# Patient Record
Sex: Female | Born: 1955 | Race: White | Hispanic: No | Marital: Married | Smoking: Never smoker
Health system: Southern US, Community
[De-identification: ages and names within clinical notes are randomized; demographics above are authoritative.]

## PROBLEM LIST (undated history)

## (undated) DIAGNOSIS — J45909 Unspecified asthma, uncomplicated: Secondary | ICD-10-CM

## (undated) DIAGNOSIS — Z8669 Personal history of other diseases of the nervous system and sense organs: Secondary | ICD-10-CM

## (undated) DIAGNOSIS — D649 Anemia, unspecified: Secondary | ICD-10-CM

## (undated) DIAGNOSIS — I10 Essential (primary) hypertension: Secondary | ICD-10-CM

## (undated) DIAGNOSIS — K5090 Crohn's disease, unspecified, without complications: Secondary | ICD-10-CM

## (undated) DIAGNOSIS — H93A9 Pulsatile tinnitus, unspecified ear: Secondary | ICD-10-CM

## (undated) HISTORY — DX: Anemia, unspecified: D64.9

## (undated) HISTORY — DX: Personal history of other diseases of the nervous system and sense organs: Z86.69

## (undated) HISTORY — DX: Essential (primary) hypertension: I10

## (undated) HISTORY — DX: Unspecified asthma, uncomplicated: J45.909

## (undated) HISTORY — PX: BREAST ENHANCEMENT SURGERY: SHX7

## (undated) HISTORY — PX: APPENDECTOMY: SHX54

## (undated) HISTORY — DX: Pulsatile tinnitus, unspecified ear: H93.A9

## (undated) HISTORY — PX: LAPAROTOMY: SHX154

## (undated) HISTORY — DX: Crohn's disease, unspecified, without complications: K50.90

---

## 2008-11-01 HISTORY — PX: ESOPHAGOGASTRODUODENOSCOPY: SHX1529

## 2008-11-05 HISTORY — PX: COLONOSCOPY: SHX174

## 2017-08-13 ENCOUNTER — Encounter: Payer: Self-pay | Admitting: Podiatry

## 2017-08-13 ENCOUNTER — Ambulatory Visit (INDEPENDENT_AMBULATORY_CARE_PROVIDER_SITE_OTHER): Payer: BC Managed Care – PPO

## 2017-08-13 ENCOUNTER — Ambulatory Visit: Payer: BC Managed Care – PPO | Admitting: Podiatry

## 2017-08-13 DIAGNOSIS — M2040 Other hammer toe(s) (acquired), unspecified foot: Secondary | ICD-10-CM

## 2017-08-13 DIAGNOSIS — M205X2 Other deformities of toe(s) (acquired), left foot: Secondary | ICD-10-CM | POA: Diagnosis not present

## 2017-08-13 DIAGNOSIS — M779 Enthesopathy, unspecified: Secondary | ICD-10-CM

## 2017-08-13 MED ORDER — TRIAMCINOLONE ACETONIDE 10 MG/ML IJ SUSP
10.0000 mg | Freq: Once | INTRAMUSCULAR | Status: AC
  Administered 2017-08-13: 10 mg

## 2017-08-13 NOTE — Progress Notes (Signed)
Subjective:   Patient ID: Tammy Carey, female   DOB: 62 y.o.   MRN: [id number]   HPI Patient presents stating her second toe has moved in the joint has become sore and it's occurred recently and seems to be more sore over the last few weeks   Review of Systems  All other systems reviewed and are negative.       Objective:  Physical Exam  Constitutional: She appears well-developed and well-nourished.  Cardiovascular: Intact distal pulses.  Pulmonary/Chest: Effort normal.  Musculoskeletal: Normal range of motion.  Neurological: She is alert.  Skin: Skin is warm.  Nursing note and vitals reviewed.   Neurovascular status found to be intact muscle strength is adequate range of motion within normal limits with patient found to have a transverse plane deformity second digit right that is laying on the hallux. It is localized to this area and there is quite a bit of inflammation of the second MPJ right with no other indications of pathology     Assessment:  Probability for capsular injury to the second MPJ right with fluid around the joint and pain with palpation     Plan:  H&P condition reviewed and recommended that we aspirated the joint injected and I did explain flexor plate dislocation and the possibility this will require surgery. I did proximal nerve block and then aspirated the second MPJ getting out of small amount of a pinkish-like fluid indicating there is been some damage to the capsule and I injected with a quarter cc deck some some Kenalog and applied thick plantar padding. Discussed shoe gear modifications and rigid bottom shoes with her  X-rays indicate that there is transverse deformity of the second digit right with no indication of fracture

## 2017-08-13 NOTE — Patient Instructions (Signed)
Hammer Toe Hammer toe is a change in the shape (a deformity) of your second, third, or fourth toe. The deformity causes the middle joint of your toe to stay bent. This causes pain, especially when you are wearing shoes. Hammer toe starts gradually. At first, the toe can be straightened. Gradually over time, the deformity becomes stiff and permanent. Early treatments to keep the toe straight may relieve pain. As the deformity becomes stiff and permanent, surgery may be needed to straighten the toe. What are the causes? Hammer toe is caused by abnormal bending of the toe joint that is closest to your foot. It happens gradually over time. This pulls on the muscles and connections (tendons) of the toe joint, making them weak and stiff. It is often related to wearing shoes that are too short or narrow and do not let your toes straighten. What increases the risk? You may be at greater risk for hammer toe if you:  Are female.  Are older.  Wear shoes that are too small.  Wear high-heeled shoes that pinch your toes.  Are a ballet dancer.  Have a second toe that is longer than your big toe (first toe).  Injure your foot or toe.  Have arthritis.  Have a family history of hammer toe.  Have a nerve or muscle disorder.  What are the signs or symptoms? The main symptoms of this condition are pain and deformity of the toe. The pain is worse when wearing shoes, walking, or running. Other symptoms may include:  Corns or calluses over the bent part of the toe or between the toes.  Redness and a burning feeling on the toe.  An open sore that forms on the top of the toe.  Not being able to straighten the toe.  How is this diagnosed? This condition is diagnosed based on your symptoms and a physical exam. During the exam, your health care provider will try to straighten your toe to see how stiff the deformity is. You may also have tests, such as:  A blood test to check for rheumatoid  arthritis.  An X-ray to show how severe the deformity is.  How is this treated? Treatment for this condition will depend on how stiff the deformity is. Surgery is often needed. However, sometimes a hammer toe can be straightened without surgery. Treatments that do not involve surgery include:  Taping the toe into a straightened position.  Using pads and cushions to protect the toe (orthotics).  Wearing shoes that provide enough room for the toes.  Doing toe-stretching exercises at home.  Taking an NSAID to reduce pain and swelling.  If these treatments do not help or the toe cannot be straightened, surgery is the next option. The most common surgeries used to straighten a hammer toe include:  Arthroplasty. In this procedure, part of the joint is removed, and that allows the toe to straighten.  Fusion. In this procedure, cartilage between the two bones of the joint is taken out and the bones are fused together into one longer bone.  Implantation. In this procedure, part of the bone is removed and replaced with an implant to let the toe move again.  Flexor tendon transfer. In this procedure, the tendons that curl the toes down (flexor tendons) are repositioned.  Follow these instructions at home:  Take over-the-counter and prescription medicines only as told by your health care provider.  Do toe straightening and stretching exercises as told by your health care provider.  Keep all   follow-up visits as told by your health care provider. This is important. How is this prevented?  Wear shoes that give your toes enough room and do not cause pain.  Do not wear high-heeled shoes. Contact a health care provider if:  Your pain gets worse.  Your toe becomes red or swollen.  You develop an open sore on your toe. This information is not intended to replace advice given to you by your health care provider. Make sure you discuss any questions you have with your health care  provider. Document Released: 07/17/2000 Document Revised: 02/07/2016 Document Reviewed: 11/13/2015 Elsevier Interactive Patient Education  2018 Elsevier Inc.  

## 2017-08-30 ENCOUNTER — Encounter: Payer: Self-pay | Admitting: Podiatry

## 2017-08-30 ENCOUNTER — Ambulatory Visit: Payer: BC Managed Care – PPO | Admitting: Podiatry

## 2017-08-30 DIAGNOSIS — M779 Enthesopathy, unspecified: Secondary | ICD-10-CM

## 2017-08-30 NOTE — Progress Notes (Signed)
Subjective:   Patient ID: Tammy Carey, female   DOB: 62 y.o.   MRN: [id number]   HPI Patient presents stating she has significant reduction of her discomfort and states the swelling seems to have reduced and the toe is still out of position but without discomfort   ROS      Objective:  Physical Exam  Neurovascular status intact with inflammation around the second MPJ quite a bit improved with very mild discomfort upon palpation and no indication currently of digital instability except for the medial rotation     Assessment:  Patient is doing well with good alignment noted second digit in good position and minimal swelling noted     Plan:  H&P condition reviewed and discussed rigid bottom shoes anti-inflammatories and patient will be seen back if symptoms were to persist or get worse.  Ultimately may require surgery or orthotics depending on response

## 2018-03-21 ENCOUNTER — Encounter: Payer: Self-pay | Admitting: Gastroenterology

## 2018-04-18 ENCOUNTER — Encounter: Payer: Self-pay | Admitting: Gastroenterology

## 2018-04-19 ENCOUNTER — Encounter: Payer: Self-pay | Admitting: Gastroenterology

## 2018-04-19 ENCOUNTER — Encounter

## 2018-04-19 ENCOUNTER — Encounter (INDEPENDENT_AMBULATORY_CARE_PROVIDER_SITE_OTHER): Payer: Self-pay

## 2018-04-19 ENCOUNTER — Ambulatory Visit (INDEPENDENT_AMBULATORY_CARE_PROVIDER_SITE_OTHER): Payer: BC Managed Care – PPO | Admitting: Gastroenterology

## 2018-04-19 VITALS — BP 120/82 | HR 74 | Ht 66.5 in | Wt 184.0 lb

## 2018-04-19 DIAGNOSIS — K50919 Crohn's disease, unspecified, with unspecified complications: Secondary | ICD-10-CM

## 2018-04-19 DIAGNOSIS — Z1211 Encounter for screening for malignant neoplasm of colon: Secondary | ICD-10-CM

## 2018-04-19 DIAGNOSIS — Z8719 Personal history of other diseases of the digestive system: Secondary | ICD-10-CM | POA: Diagnosis not present

## 2018-04-19 NOTE — Patient Instructions (Signed)
If you are age 61 or older, your body mass index should be between 23-30. Your Body mass index is 29.25 kg/m. If this is out of the aforementioned range listed, please consider follow up with your Primary Care Provider.  If you are age 41 or younger, your body mass index should be between 19-25. Your Body mass index is 29.25 kg/m. If this is out of the aformentioned range listed, please consider follow up with your Primary Care Provider.   You have been scheduled for a colonoscopy. Please follow written instructions given to you at your visit today.  Please pick up your prep supplies at the pharmacy within the next 1-3 days. If you use inhalers (even only as needed), please bring them with you on the day of your procedure. Your physician has requested that you go to www.startemmi.com and enter the access code given to you at your visit today. This web site gives a general overview about your procedure. However, you should still follow specific instructions given to you by our office regarding your preparation for the procedure.  Thank you,  Dr. Jackquline Denmark

## 2018-04-19 NOTE — Progress Notes (Signed)
Chief Complaint: Recent diverticulitis  Referring Provider: Dr. Marco Collie      ASSESSMENT AND PLAN;   #1. Colorectal cancer screening. #2. H/O diverticulitis (CT June 2019, cipro and flagyl)). #3. Crohn's disease: Dx 2006, involving terminal ileum. Elevated CRP.  Subsequent colonoscopies were negative.  Last colonoscopy 11/2008- with negative TI and random colonic biopsies.  Did show mild sigmoid diverticulosis #4. 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 Uterine fibroids (on CT 8.2 cm 09/2008). Plan: - Recent CT report from [facility]. - Proceed with colonoscopy with MiraLAX preparation.  I discussed risks and benefits.  This will be scheduled in coming days. - Continue gluten-free diet.  Watch fiber intake.  Excessive fiber may cause diarrhea and more abdominal bloating. - Consider IBD serology if needed.   HPI:    Tammy Carey is a 62 y.o. female  Seen at request of Dr. Nyra Capes H/O diverticulitis on CT June 2019, treated with Cipro and Flagyl for 10 days. (she had right sided abdominal pain, CT showed left sigmoid diverticulitis without any abscesses.  C-reactive protein was significantly elevated 23 -recent C-reactive protein was 3) No nausea, vomiting, heartburn, regurgitation, odynophagia or dysphagia.  No significant diarrhea or constipation.  There is no melena or hematochezia. No unintentional weight loss. Has been on gluten-free diet-feels better.  Past GI procedures: -Colonoscopy ([facility]) 11/05/2008: Mild sigmoid diverticulosis, otherwise normal colon to TI, neg TI and random colonic biopsies. Past Medical History:  Diagnosis Date  . Anemia   . Asthma   . Crohn's disease ([place])   . Hx of migraine headaches     Past Surgical History:  Procedure Laterality Date  . APPENDECTOMY    . COLONOSCOPY  11/05/2008   Mild colonic diverticulosis. Otherwise normal colonoscopy to TI. Minimal  activity by endoscopic criteria.   [facility] Kitchen ESOPHAGOGASTRODUODENOSCOPY  11/01/2008   Normal EGD.   Marland Kitchen LAPAROTOMY     for endometriosis    Family History  Problem Relation Age of Onset  . Hypertension Mother     Social History  Assoc Agricultural consultant Tobacco Use  . Smoking status: Never Smoker  . Smokeless tobacco: Never Used  Substance Use Topics  . Alcohol use: Yes    Alcohol/week: 3.0 standard drinks    Types: 3 Standard drinks or equivalent per week  . Drug use: Never    Current Outpatient Medications  Medication Sig Dispense Refill  . ESTROGENS CONJ SYNTHETIC A PO Take 1 tablet by mouth daily.    . progesterone (PROMETRIUM) 100 MG capsule      No current facility-administered medications for this visit.     Allergies  Allergen Reactions  . Penicillins Other (See Comments)    Pt. Stated," I just know I am."    Review of Systems:  Constitutional: Denies fever, chills, diaphoresis, appetite change and fatigue.  HEENT: Denies photophobia, eye pain, redness, hearing loss, ear pain, congestion, sore throat, rhinorrhea, sneezing, mouth sores, neck pain, neck stiffness and tinnitus.   Respiratory: Denies SOB, DOE, cough, chest tightness,  and wheezing.   Cardiovascular: Denies chest pain, palpitations and leg swelling.  Genitourinary: Denies dysuria, urgency, frequency, hematuria, flank pain and difficulty urinating.  Musculoskeletal: Denies myalgias, back pain, joint swelling, arthralgias and gait problem.  Skin: Has a small skin lesion in the right upper chest Neurological: Denies dizziness, seizures, syncope, weakness, light-headedness, numbness and has migraine headaches.  Hematological: Denies adenopathy. Easy bruising, personal or  family bleeding history  Psychiatric/Behavioral: Has anxiety, no depression     Physical Exam:    BP 120/82   Pulse 74   Ht 5' 6.5" (1.689 m)   Wt 184 lb (83.5 kg)   SpO2 95%   BMI 29.25 kg/m  Filed Weights   04/19/18 0947  Weight:  184 lb (83.5 kg)   Constitutional:  Well-developed, in no acute distress. Psychiatric: Normal mood and affect. Behavior is normal. HEENT: Pupils normal.  Conjunctivae are normal. No scleral icterus. Cardiovascular: Normal rate, regular rhythm. No edema Pulmonary/chest: Effort normal and breath sounds normal. No wheezing, rales or rhonchi. Abdominal: Soft, nondistended. Nontender. Bowel sounds active throughout. There are no masses palpable. No hepatomegaly. Rectal:  defered Neurological: Alert and oriented to person place and time. Skin: Skin is warm and dry. No rashes noted.     [name], MD 04/19/2018, 9:56 AM  Cc: Dr. Marco Collie

## 2018-05-04 ENCOUNTER — Encounter: Payer: Self-pay | Admitting: Gastroenterology

## 2018-05-04 ENCOUNTER — Ambulatory Visit (AMBULATORY_SURGERY_CENTER): Payer: BC Managed Care – PPO | Admitting: Gastroenterology

## 2018-05-04 VITALS — BP 122/68 | HR 60 | Temp 97.8°F | Resp 11 | Ht 66.5 in | Wt 184.0 lb

## 2018-05-04 DIAGNOSIS — K50919 Crohn's disease, unspecified, with unspecified complications: Secondary | ICD-10-CM

## 2018-05-04 MED ORDER — SODIUM CHLORIDE 0.9 % IV SOLN: 500.0000 mL | Freq: Once | INTRAVENOUS | Status: DC

## 2018-05-04 NOTE — Progress Notes (Signed)
Called to room to assist during endoscopic procedure.  Patient ID and intended procedure confirmed with present staff. Received instructions for my participation in the procedure from the performing physician.  

## 2018-05-04 NOTE — Op Note (Signed)
[facility] Patient Name: Tammy Carey Procedure Date: 05/04/2018 9:21 AM MRN: [id number] Endoscopist: Jackquline Denmark , MD Age: 62 Referring MD:  Date of Birth: March 25, 1956 Gender: Female Account #: [id number] Procedure:                Colonoscopy Indications:              #1. Colorectal cancer screening.                           #2. H/O diverticulitis (CT June 2019, cipro and                            flagyl)).                           #3. Crohn's disease: Dx 2006, involving terminal                            ileum.                           #4. IBS with diarrhea (may have non-celiac gluten                            sensitivity, negative small bowel biopsies for                            celiac on EGD 11/2008) Medicines:                Monitored Anesthesia Care Procedure:                Pre-Anesthesia Assessment:                           - Prior to the procedure, a History and Physical                            was performed, and patient medications and                            allergies were reviewed. The patient's tolerance of                            previous anesthesia was also reviewed. The risks                            and benefits of the procedure and the sedation                            options and risks were discussed with the patient.                            All questions were answered, and informed consent                            was obtained. Prior Anticoagulants: The patient has  taken no previous anticoagulant or antiplatelet                            agents. ASA Grade Assessment: II - A patient with                            mild systemic disease. After reviewing the risks                            and benefits, the patient was deemed in                            satisfactory condition to undergo the procedure.                           After obtaining informed consent, the colonoscope         was passed under direct vision. Throughout the                            procedure, the patient's blood pressure, pulse, and                            oxygen saturations were monitored continuously. The                            Model PCF-H190DL [id number]) scope was introduced                            through the anus and advanced to the 4 cm into the                            ileum. The colonoscopy was performed without                            difficulty. The patient tolerated the procedure                            well. The quality of the bowel preparation was good. Scope In: 9:37:40 AM Scope Out: 9:55:16 AM Scope Withdrawal Time: 0 hours 13 minutes 14 seconds  Total Procedure Duration: 0 hours 17 minutes 36 seconds  Findings:                 A few scattered non-bleeding erosions were found in                            the ascending colon and in the cecum. 1 cm single                            superficial ulcer was also noted in the distal                            ascending colon. No stigmata of recent bleeding  were seen. Biopsies were taken with a cold forceps                            for histology. Estimated blood loss: none.                           The terminal ileum appeared normal. Biopsies were                            taken with a cold forceps for histology. Estimated                            blood loss: none. The remaining colonic mucosa was                            normal. Multiple biopsies were also obtained from                            the left colon including descending colon, sigmoid                            colon and from the rectum and for histology                           Multiple small-mouthed diverticula were found in                            the sigmoid colon and descending colon.                           Non-bleeding internal hemorrhoids were found during                            retroflexion. The  hemorrhoids were small.                           The exam was otherwise without abnormality. Complications:            No immediate complications. Estimated Blood Loss:     Estimated blood loss: none. Impression:               - A few erosions in the ascending colon and in the                            cecum. Biopsied.                           - Moderate left colonic diverticulosis.                           - Non-bleeding internal hemorrhoids. Recommendation:           - Patient has a contact number available for                            emergencies. The  signs and symptoms of potential                            delayed complications were discussed with the                            patient. Return to normal activities tomorrow.                            Written discharge instructions were provided to the                            patient.                           - Resume previous diet.                           - No aspirin, ibuprofen, naproxen, or other                            non-steroidal anti-inflammatory drugs.                           - Continue present medications.                           - Await pathology results.                           - Repeat colonoscopy for surveillance based on                            pathology results.                           - Return to GI clinic in 12 weeks. Jackquline Denmark, MD 05/04/2018 10:04:36 AM This report has been signed electronically.

## 2018-05-04 NOTE — Progress Notes (Signed)
To PACU, VSS. Report to [email or web address] 

## 2018-05-04 NOTE — Patient Instructions (Signed)
Impression/Recommendations:  Diverticulosis handout given to patient. Hemorrhoid handout given to patient.  Resume previous diet. Continue present medications.  No aspirin, ibuprofen, naproxen, or other NSAID drugs.  Tylenol only.  Repeat colonoscopy for surveillance.  Return to GI clinic in 12 weeks.  YOU HAD AN ENDOSCOPIC PROCEDURE TODAY AT [facility]:   Refer to the procedure report that was given to you for any specific questions about what was found during the examination.  If the procedure report does not answer your questions, please call your gastroenterologist to clarify.  If you requested that your care partner not be given the details of your procedure findings, then the procedure report has been included in a sealed envelope for you to review at your convenience later.  YOU SHOULD EXPECT: Some feelings of bloating in the abdomen. Passage of more gas than usual.  Walking can help get rid of the air that was put into your GI tract during the procedure and reduce the bloating. If you had a lower endoscopy (such as a colonoscopy or flexible sigmoidoscopy) you may notice spotting of blood in your stool or on the toilet paper. If you underwent a bowel prep for your procedure, you may not have a normal bowel movement for a few days.  Please Note:  You might notice some irritation and congestion in your nose or some drainage.  This is from the oxygen used during your procedure.  There is no need for concern and it should clear up in a day or so.  SYMPTOMS TO REPORT IMMEDIATELY:   Following lower endoscopy (colonoscopy or flexible sigmoidoscopy):  Excessive amounts of blood in the stool  Significant tenderness or worsening of abdominal pains  Swelling of the abdomen that is new, acute  Fever of 100F or higher  For urgent or emergent issues, a gastroenterologist can be reached at any hour by calling [phone].   DIET:  We do recommend a small meal at first,  but then you may proceed to your regular diet.  Drink plenty of fluids but you should avoid alcoholic beverages for 24 hours.  ACTIVITY:  You should plan to take it easy for the rest of today and you should NOT DRIVE or use heavy machinery until tomorrow (because of the sedation medicines used during the test).    FOLLOW UP: Our staff will call the number listed on your records the next business day following your procedure to check on you and address any questions or concerns that you may have regarding the information given to you following your procedure. If we do not reach you, we will leave a message.  However, if you are feeling well and you are not experiencing any problems, there is no need to return our call.  We will assume that you have returned to your regular daily activities without incident.  If any biopsies were taken you will be contacted by phone or by letter within the next 1-3 weeks.  Please call us at [phone] if you have not heard about the biopsies in 3 weeks.    SIGNATURES/CONFIDENTIALITY: You and/or your care partner have signed paperwork which will be entered into your electronic medical record.  These signatures attest to the fact that that the information above on your After Visit Summary has been reviewed and is understood.  Full responsibility of the confidentiality of this discharge information lies with you and/or your care-partner.

## 2018-05-05 ENCOUNTER — Telehealth: Payer: Self-pay

## 2018-05-05 NOTE — Telephone Encounter (Signed)
  Follow up Call-  Call Tammy Carey number 05/04/2018  Post procedure Call Tammy Carey phone  # [phone]  Permission to leave phone message Yes     Patient questions:  Do you have a fever, pain , or abdominal swelling? No. Pain Score  0 *  Have you tolerated food without any problems? Yes.    Have you been able to return to your normal activities? Yes.    Do you have any questions about your discharge instructions: Diet   No. Medications  No. Follow up visit  No.  Do you have questions or concerns about your Care? No.  Actions: * If pain score is 4 or above: No action needed, pain <4.

## 2018-05-05 NOTE — Telephone Encounter (Signed)
Left message for first follow up call.

## 2018-05-16 ENCOUNTER — Telehealth: Payer: Self-pay | Admitting: Gastroenterology

## 2018-05-16 ENCOUNTER — Other Ambulatory Visit: Payer: Self-pay

## 2018-05-16 MED ORDER — BUDESONIDE 3 MG PO CPEP: ORAL_CAPSULE | ORAL | 2 refills | Status: DC

## 2018-05-16 MED ORDER — MESALAMINE 1.2 G PO TBEC: 2.4000 g | DELAYED_RELEASE_TABLET | Freq: Every day | ORAL | 3 refills | Status: DC

## 2018-05-16 NOTE — Telephone Encounter (Signed)
Pt called stating that she was returning your call. She would like a call back before 3:30pm if possible as she would be attending a meeting.

## 2018-05-16 NOTE — Telephone Encounter (Signed)
Pt requested prescriptions be sent to [facility] on [address] in [place], scripts sent to [facility] per request.

## 2018-06-02 ENCOUNTER — Other Ambulatory Visit: Payer: Self-pay

## 2018-06-02 ENCOUNTER — Telehealth: Payer: Self-pay | Admitting: Gastroenterology

## 2018-06-02 DIAGNOSIS — K50919 Crohn's disease, unspecified, with unspecified complications: Secondary | ICD-10-CM

## 2018-06-02 MED ORDER — METRONIDAZOLE IN NACL 5-0.79 MG/ML-% IV SOLN
500.00 | INTRAVENOUS | Status: DC
Start: 2018-06-02 — End: 2018-06-02

## 2018-06-02 MED ORDER — ALBUTEROL SULFATE HFA 108 (90 BASE) MCG/ACT IN AERS
2.00 | INHALATION_SPRAY | RESPIRATORY_TRACT | Status: DC
Start: ? — End: 2018-06-02

## 2018-06-02 MED ORDER — ONDANSETRON 4 MG PO TBDP
4.00 | ORAL_TABLET | ORAL | Status: DC
Start: ? — End: 2018-06-02

## 2018-06-02 MED ORDER — PREDNISONE 10 MG PO TABS: ORAL_TABLET | ORAL | 0 refills | Status: DC

## 2018-06-02 MED ORDER — IOHEXOL 350 MG/ML SOLN
100.00 | INTRAVENOUS | Status: DC
Start: ? — End: 2018-06-02

## 2018-06-02 MED ORDER — OXYCODONE HCL 5 MG PO TABS
5.00 | ORAL_TABLET | ORAL | Status: DC
Start: ? — End: 2018-06-02

## 2018-06-02 MED ORDER — CIPROFLOXACIN IN D5W 400 MG/200ML IV SOLN
400.00 | INTRAVENOUS | Status: DC
Start: 2018-06-02 — End: 2018-06-02

## 2018-06-02 MED ORDER — ENOXAPARIN SODIUM 40 MG/0.4ML ~~LOC~~ SOLN
40.00 | SUBCUTANEOUS | Status: DC
Start: 2018-06-02 — End: 2018-06-02

## 2018-06-02 NOTE — Telephone Encounter (Signed)
Pt is hospitalized due to a crohns flare up, she wants to know Dr. Steve Rattler advise to prevent it from happening again.

## 2018-06-02 NOTE — Telephone Encounter (Signed)
Pt called again because she is at the hospital and she don't want to live until she speak with the nurse.

## 2018-06-02 NOTE — Telephone Encounter (Signed)
Reviewed in detail with the patient. Left the phone number on voicemail per her request. Rx to [facility]. She repeats back the plan of care. Thanks me for my call.

## 2018-06-02 NOTE — Telephone Encounter (Signed)
I tried calling the physician at [facility] last night-there was no answer Have reviewed the records I think there is an exacerbation of Crohn's disease as well. She can stop taking budesonide Continue mesalamine Must take and finish antibiotics  Also needs a short course of steroids.  Tammy Carey, Lets start prednisone 20 mg p.o. once a day x 2 weeks, then 10 mg p.o. once a day for 2 more weeks.  She is to call us and let us know how she is doing next week as well. Pl give her my cell No  [phone].  She can text me or call me if she has any problems over the weekend

## 2018-06-02 NOTE — Telephone Encounter (Signed)
Patient was discharged from the hospital in her area today. You can view the records from within Care Everywhere. She was in [facility]. She has had a CT. Diagnosed with diverticulitis. She does not have confidence in this diagnosis. She is on Cipro and Flagyl. She states she received Solu-medrol injection.  She is very stressed about her situation. Concerned this is a flare because she has been in remission for so very long. The pain she experienced was "terrible." She would like you to review her records and advise her please.  Thank you

## 2018-06-21 ENCOUNTER — Telehealth: Payer: Self-pay | Admitting: Gastroenterology

## 2018-06-21 NOTE — Telephone Encounter (Signed)
Pt called to inform that she is doing well, she is about to finish prednisone and is still taking lialda, she wants to know if she needs to go back to entocort or what Dr. Lyndel Safe suggests so she does not have another flare up.

## 2018-06-21 NOTE — Telephone Encounter (Signed)
Please refer to patient message

## 2018-06-21 NOTE — Telephone Encounter (Signed)
She just finished prednisone Entocort -would be less effective than prednisone. Lets continue Lialda If we could get blood tests repeated-CBC, CMP and C-reactive protein. Get TB gold, HbsAg and TPMT genotype.  We may have to start her on Humira if still with problems. She having any diarrhea?

## 2018-06-22 ENCOUNTER — Other Ambulatory Visit: Payer: Self-pay

## 2018-06-22 DIAGNOSIS — K50919 Crohn's disease, unspecified, with unspecified complications: Secondary | ICD-10-CM

## 2018-06-22 NOTE — Telephone Encounter (Signed)
Called and spoke with patient- patient states "things are getting better with the diarrhea"; patient reports she still has 7 days left to take the Prednisone; patient informed of MD recommendations and is agreeable with the plan of care; patient reports she would like to have the blood work done at the [facility] lab; orders will be placed in [place]; patient verbalized understanding to finish the Prednisone dosage, continue Lialda, and to call back if symptoms return or worsen; patient also advised to call back if questions/concerns arise;

## 2018-06-27 ENCOUNTER — Other Ambulatory Visit (INDEPENDENT_AMBULATORY_CARE_PROVIDER_SITE_OTHER): Payer: BC Managed Care – PPO

## 2018-06-27 DIAGNOSIS — K50919 Crohn's disease, unspecified, with unspecified complications: Secondary | ICD-10-CM

## 2018-06-27 LAB — COMPREHENSIVE METABOLIC PANEL
ALT: 14 U/L (ref 0–35)
AST: 14 U/L (ref 0–37)
Albumin: 4.1 g/dL (ref 3.5–5.2)
Alkaline Phosphatase: 37 U/L — ABNORMAL LOW (ref 39–117)
BILIRUBIN TOTAL: 0.4 mg/dL (ref 0.2–1.2)
BUN: 13 mg/dL (ref 6–23)
CALCIUM: 9.1 mg/dL (ref 8.4–10.5)
CHLORIDE: 102 meq/L (ref 96–112)
CO2: 29 meq/L (ref 19–32)
Creatinine, Ser: 0.7 mg/dL (ref 0.40–1.20)
GFR: 90.28 mL/min (ref >60.00)
GLUCOSE: 125 mg/dL — AB (ref 70–99)
Potassium: 4.1 mEq/L (ref 3.5–5.1)
SODIUM: 139 meq/L (ref 135–145)
Total Protein: 7 g/dL (ref 6.0–8.3)

## 2018-06-27 LAB — CBC WITH DIFFERENTIAL/PLATELET
BASOS ABS: 0.1 10*3/uL (ref 0.0–0.1)
Basophils Relative: 1.4 % (ref 0.0–3.0)
EOS PCT: 0.1 % (ref 0.0–5.0)
Eosinophils Absolute: 0 10*3/uL (ref 0.0–0.7)
HEMATOCRIT: 38.5 % (ref 36.0–46.0)
Hemoglobin: 12.9 g/dL (ref 12.0–15.0)
LYMPHS PCT: 10.5 % — AB (ref 12.0–46.0)
Lymphs Abs: 1 10*3/uL (ref 0.7–4.0)
MCHC: 33.5 g/dL (ref 30.0–36.0)
MCV: 89.9 fl (ref 78.0–100.0)
MONOS PCT: 2.3 % — AB (ref 3.0–12.0)
Monocytes Absolute: 0.2 10*3/uL (ref 0.1–1.0)
NEUTROS ABS: 7.9 10*3/uL — AB (ref 1.4–7.7)
Neutrophils Relative %: 85.7 % — ABNORMAL HIGH (ref 43.0–77.0)
PLATELETS: 411 10*3/uL — AB (ref 150.0–400.0)
RBC: 4.28 Mil/uL (ref 3.87–5.11)
RDW: 15.4 % (ref 11.5–15.5)
WBC: 9.2 10*3/uL (ref 4.0–10.5)

## 2018-06-27 LAB — C-REACTIVE PROTEIN: CRP: 0.5 mg/dL (ref 0.5–20.0)

## 2018-06-28 ENCOUNTER — Other Ambulatory Visit: Payer: Self-pay

## 2018-06-28 NOTE — Progress Notes (Signed)
Humira ordered through [facility] for starter kit and maintenance dosing schedule;

## 2018-07-02 LAB — QUANTIFERON-TB GOLD PLUS
MITOGEN-NIL: 9.33 [IU]/mL
NIL: 0.02 IU/mL
QuantiFERON-TB Gold Plus: NEGATIVE
TB1-NIL: 0 [IU]/mL
TB2-NIL: 0 [IU]/mL

## 2018-07-02 LAB — THIOPURINE METHYLTRANSFERASE (TPMT), RBC: THIOPURINE METHYLTRANSFERASE, RBC: 16 nmol/h/mL

## 2018-07-02 LAB — HEPATITIS B SURFACE ANTIGEN: Hepatitis B Surface Ag: NONREACTIVE

## 2018-07-06 ENCOUNTER — Telehealth: Payer: Self-pay

## 2018-07-06 NOTE — Telephone Encounter (Signed)
Called and spoke with patient-patient informed of Humira being approved by [facility] and is being handled by [facility]; patient verbalized understanding of information/instructions; patient requesting to know if she should continue the Lialda while taking Humira? Is it okay to wait to start the Humira until after Christmas as she is not wanting side effects all while celebrating wedding anniversary and [place] resort she is unable to get a refund for? Please advise;

## 2018-07-07 NOTE — Telephone Encounter (Signed)
Lets continue Lialda Okay to start Humira after Christmas When you talk to her, tell her I said Tammy Carey Christmas and happy new year to her and her husband Lysle Rubens

## 2018-07-07 NOTE — Telephone Encounter (Signed)
Called and spoke to patient-patient informed of MD recommendations (special message) and patient is agreeable to plan of care; patient advised to call back if questions/concerns arise;

## 2018-07-08 ENCOUNTER — Other Ambulatory Visit: Payer: Self-pay

## 2018-07-08 DIAGNOSIS — K50919 Crohn's disease, unspecified, with unspecified complications: Secondary | ICD-10-CM

## 2018-07-08 MED ORDER — ADALIMUMAB 40 MG/0.4ML ~~LOC~~ AJKT: 1.0000 "pen " | AUTO-INJECTOR | SUBCUTANEOUS | 12 refills | Status: DC

## 2018-07-08 NOTE — Telephone Encounter (Signed)
Patient calling regarding humira, she needs contact info for the pharmacy that we use for her humira prescriptions. Pls call her.

## 2018-07-08 NOTE — Telephone Encounter (Addendum)
Humira RX submitted to [facility]; patient aware of RX being sent;

## 2018-07-11 ENCOUNTER — Telehealth: Payer: Self-pay | Admitting: Gastroenterology

## 2018-07-12 NOTE — Telephone Encounter (Signed)
Called and spoke with patient- patient report symptoms started on Saturday -fatigue and low low back pain-patient denies nausea, vomiting, diarrhea, abdominal pain; patient states "I am just feeling really fatigued and I am having low, low back pain that I just ended up taking a Tylenol for but I did not know if this could be a viral thing or not"; patient advised since there were not any gi symptoms patient should call her PCP and to call this office back if questions/concerns arise pertaining to gi symptoms; patient also reports she has not started the Humira as of yet therefore there are no reactions to contribute her fatigue or low back pain; patient is agreeable with "riding out the symptoms and seeing if I get any better and I will call Dr. Lyndel Safe back if I feel I need to be seen or need answers to questions;

## 2018-08-05 ENCOUNTER — Telehealth: Payer: Self-pay | Admitting: Gastroenterology

## 2018-08-05 NOTE — Telephone Encounter (Signed)
We can Rx dicyclomine 20 mg q 6 hrs prn # 60 no refill  It can relieve cramps and pain  She should know someone always on call if things worsen but I would not start steroids at this time

## 2018-08-05 NOTE — Telephone Encounter (Signed)
Pt thinks that she is having a flare, she states to be on day 5th of humira. Would like a call.

## 2018-08-05 NOTE — Telephone Encounter (Signed)
Called and spoke with patient-patient reports lower back discomfort, "blah" feeling, generalized aches and pains, low grade temp (99.6 at the highest-currently 97.2), abdominal tenderness (0-10=pain with palpation (pain=4-5); random nausea no vomiting; normal bowel movements for patient until yesterday when the "ribbon poops" 6 times/day; patient also reports she took her Humira on 07/31/18 160 mg dose= not due for the next injection until 08/15/2018;  Patient is requesting to know if there is anything else she can do for her "flare up" she feels she is having right now besides the Mesalamine; she does not know if this is truly the beginning of a flare however, wanted to know should the symptoms worsen over the weekend; these are "not my normal symptoms and I just need to know if there is anything else I need to be doing"

## 2018-08-05 NOTE — Telephone Encounter (Signed)
Patient is advised. She says she has Dicyclomine. She will try this. Aware of the after hours contact system.

## 2018-09-19 ENCOUNTER — Telehealth: Payer: Self-pay | Admitting: Gastroenterology

## 2018-09-19 DIAGNOSIS — K50919 Crohn's disease, unspecified, with unspecified complications: Secondary | ICD-10-CM

## 2018-09-19 NOTE — Telephone Encounter (Signed)
Called and spoke with patient-patient reports she started the Humira on Jan 7th; stress level is up per patient report-husband had a heart on Sunday 08/28/2018; patient reports she has also been interviewing for an alternative job as well-patient reports she has not been eating like she is supposed to -no fever, sensitive on RLQ and below umbilicus-PCP reports she may be having "a flare" and was prescribed Prednisone 40mg  on Friday pm-took 60mg  on Saturday and Sunday-has enough on RX for 3 weeks- was advised by PCP to notify Dr. Helene Shoe 09/10/2018 patient took her 2nd 40 mg dose of Humira- Patient reports she is not in any distress or pain at this time-just need to know the next step;  Please advise if there are any additional steps she needs to be taking at this time-patient reports she is trying to control her stress level-however, does not want to stay on the Prednisone any longer than needed;

## 2018-09-19 NOTE — Telephone Encounter (Signed)
Pt stated she just had a flare up.  Pt's PCP started her on prednisone.  Pt is on 4th dose of Humira.  Please CB.

## 2018-09-20 NOTE — Telephone Encounter (Signed)
Pt called back wanting to speak with the nurse regarding call from yesterday

## 2018-09-21 MED ORDER — PREDNISONE 10 MG PO TABS: ORAL_TABLET | ORAL | 0 refills | Status: DC

## 2018-09-21 NOTE — Telephone Encounter (Signed)
Called and spoke with patient-patient informed of MD recommendations; patient is agreeable with plan of care RX sent to pharmacy; lab orders placed in Epic; appt for lab work scheduled for patient; Patient verbalized understanding of information/instructions;  Patient was advised to call the office at [phone] if questions/concerns arise;

## 2018-09-21 NOTE — Telephone Encounter (Signed)
PT calling back

## 2018-09-21 NOTE — Telephone Encounter (Signed)
-  Prednisone 40 mg p.o. once a day for 1 week, 30 mg p.o. once a day for 1 week, followed by 20 mg p.o. once a day for 1 week, then 10 mg p.o. once a day for 2 weeks and then stop. -Continue with humira (dose as prescribed) for now -Check CBC, CMP, sed rate and CRP in 4 weeks -How is Tammy Carey doing? (her husband). -Also give Tammy Carey my cell phone.  She can also text me.

## 2018-09-21 NOTE — Telephone Encounter (Signed)
Please see the message from 09/19/2018 at 10:30 am and please advise

## 2018-11-04 ENCOUNTER — Ambulatory Visit: Payer: BC Managed Care – PPO | Admitting: Gastroenterology

## 2018-11-11 ENCOUNTER — Other Ambulatory Visit: Payer: BC Managed Care – PPO

## 2019-01-04 ENCOUNTER — Other Ambulatory Visit: Payer: Self-pay | Admitting: Gastroenterology

## 2019-04-02 ENCOUNTER — Other Ambulatory Visit: Payer: Self-pay | Admitting: Gastroenterology

## 2019-04-11 ENCOUNTER — Telehealth: Payer: Self-pay | Admitting: Gastroenterology

## 2019-04-13 NOTE — Telephone Encounter (Signed)
Called and spoke with patient-patient reports she had her blood drawn on 04/12/2019-just so Dr. Lyndel Safe is aware;  Patient also reports she had used some steroid eye drops- 3 on Saturday, 2 on Sunday, and 1 time on Monday; called PCP and was informed it may be related to Crohn's-took Humira on Sunday-  Fatigue, low back pain, RLQ/LLQ abd tenderness,  UA was negative, waiting on CBC and CRP;   "Feeling fine today"- just wanted to report she is still taking the Mesalamine as well; patient reports she just wanted Dr.Gupta to be aware of potential "flare" after using a steroidal eye drop;  Please advise if the patient needs to do anything further for her care as she reports she is being followed closely by PCP at this time;

## 2019-04-17 NOTE — Telephone Encounter (Signed)
Bre Can you get the lab results from Dr Marco Collie for me (drawn 9/9) Then, I will call her  Thx  RG

## 2019-04-20 NOTE — Telephone Encounter (Signed)
Paperwork requested from Dr. Marco Collie office; will place on MD desk when becomes available;

## 2019-04-24 NOTE — Telephone Encounter (Signed)
Paper work has been placed on MD desk for review

## 2019-05-09 NOTE — Telephone Encounter (Signed)
Trish, Can you pl place it on my desk (separate stack please) thx RG

## 2019-06-26 ENCOUNTER — Other Ambulatory Visit: Payer: Self-pay | Admitting: Gastroenterology

## 2019-06-26 DIAGNOSIS — K50919 Crohn's disease, unspecified, with unspecified complications: Secondary | ICD-10-CM

## 2019-07-22 ENCOUNTER — Other Ambulatory Visit: Payer: Self-pay | Admitting: Gastroenterology

## 2019-09-19 ENCOUNTER — Other Ambulatory Visit: Payer: Self-pay | Admitting: Gastroenterology

## 2019-09-19 DIAGNOSIS — K50919 Crohn's disease, unspecified, with unspecified complications: Secondary | ICD-10-CM

## 2019-10-20 ENCOUNTER — Other Ambulatory Visit: Payer: Self-pay | Admitting: Gastroenterology

## 2019-11-09 ENCOUNTER — Other Ambulatory Visit: Payer: Self-pay | Admitting: Gastroenterology

## 2019-11-09 DIAGNOSIS — K50919 Crohn's disease, unspecified, with unspecified complications: Secondary | ICD-10-CM

## 2019-12-19 ENCOUNTER — Other Ambulatory Visit: Payer: Self-pay

## 2019-12-19 ENCOUNTER — Ambulatory Visit: Payer: BC Managed Care – PPO | Admitting: Gastroenterology

## 2019-12-19 ENCOUNTER — Encounter: Payer: Self-pay | Admitting: Gastroenterology

## 2019-12-19 VITALS — BP 126/80 | HR 73 | Temp 96.9°F | Ht 66.0 in | Wt 186.1 lb

## 2019-12-19 DIAGNOSIS — K50919 Crohn's disease, unspecified, with unspecified complications: Secondary | ICD-10-CM | POA: Diagnosis not present

## 2019-12-19 NOTE — Patient Instructions (Signed)
If you are age 63 or older, your body mass index should be between 23-30. Your Body mass index is 30.04 kg/m. If this is out of the aforementioned range listed, please consider follow up with your Primary Care Provider.  If you are age 81 or younger, your body mass index should be between 19-25. Your Body mass index is 30.04 kg/m. If this is out of the aformentioned range listed, please consider follow up with your Primary Care Provider.   Take mesalamine once daily x 2 weeks and then stop.   Follow up in 6 months.   Thank you,  Dr. Jackquline Denmark

## 2019-12-19 NOTE — Progress Notes (Signed)
Chief Complaint: Recent diverticulitis  Referring Provider: Dr. Marco Collie      ASSESSMENT AND PLAN;   #1. Crohn's disease: Dx 2006, involving R colon. TI Nl on colon 05/2018. Elevated CRP.  #2. H/O diverticulitis (CT June 2019, cipro and flagyl)). 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 Uterine fibroids (on CT 8.2 cm 09/2008).  Plan: - Continue Humira 40mg  Q2 weekly - Wean off mesalamine 1/2 weeks - Check CBC, CMP, CRP and sed rate. - Obtain records from Dr. Nyra Capes office. - FU in 6 months  Addendum: We obtained a blood work from Dr. Nyra Capes office on 11/25/2018 when her WBC count was 6.6, hemoglobin 13.2, MCV 91, platelets 433.  Normal CMP with BUN 12/creatinine 0.78.  Normal LFTs with AST 13, ALT 11.  Vitamin B12 865.  CRP 2 HPI:    Tammy Carey is a 64 y.o. female  For follow-up visit. Doing very well.  Back to baseline of BM 1 to 2/day.   No nausea, vomiting, heartburn, regurgitation, odynophagia or dysphagia.  No significant diarrhea or constipation.  No melena or hematochezia. No unintentional weight loss. No abdominal pain.  Has been bothered by tinnitus.  She would like to stop mesalamine.  Seen at request of Dr. Nyra Capes H/O diverticulitis on CT June 2019, treated with Cipro and Flagyl for 10 days. (she had right sided abdominal pain, CT showed left sigmoid diverticulitis without any abscesses.  C-reactive protein was significantly elevated 23 -recent C-reactive protein was 3) No nausea, vomiting, heartburn, regurgitation, odynophagia or dysphagia.  No significant diarrhea or constipation.  There is no melena or hematochezia. No unintentional weight loss. Has been on gluten-free diet-feels better.  Past GI procedures: -Colonoscopy ([facility]) 11/05/2008: Mild sigmoid diverticulosis, otherwise normal colon to TI, neg TI and random colonic biopsies. Past Medical History:  Diagnosis Date  .  Anemia   . Asthma   . Crohn's disease ([place])   . Hx of migraine headaches   . Hypertension   . Pulsatile tinnitus     Past Surgical History:  Procedure Laterality Date  . APPENDECTOMY    . COLONOSCOPY  11/05/2008   Mild colonic diverticulosis. Otherwise normal colonoscopy to TI. Minimal activity by endoscopic criteria.   Marland Kitchen ESOPHAGOGASTRODUODENOSCOPY  11/01/2008   Normal EGD.   Marland Kitchen LAPAROTOMY     for endometriosis    Family History  Problem Relation Age of Onset  . Hypertension Mother     Social History  Assoc Agricultural consultant Tobacco Use  . Smoking status: Never Smoker  . Smokeless tobacco: Never Used  Substance Use Topics  . Alcohol use: Yes    Alcohol/week: 3.0 standard drinks    Types: 3 Standard drinks or equivalent per week  . Drug use: Never    Current Outpatient Medications  Medication Sig Dispense Refill  . AMBULATORY NON FORMULARY MEDICATION as needed. GI cocktail    . DULoxetine (CYMBALTA) 30 MG capsule Take 30 mg by mouth daily.    Marland Kitchen ESTROGENS CONJ SYNTHETIC A PO Take 1 tablet by mouth daily.    Marland Kitchen HUMIRA PEN 40 MG/0.4ML PNKT INJECT 1 PEN UNDER THE SKIN EVERY 14 DAYS. 2 each 3  . mesalamine (LIALDA) 1.2 g EC tablet TAKE 2 TABLETS (2.4 GRAMS TOTAL) BY MOUTH EVERY DAY WITH BREAKFAST 180 tablet 0  . progesterone (PROMETRIUM) 100 MG capsule     . verapamil (VERELAN PM) 120 MG 24 hr capsule  Take 120 mg by mouth daily.     No current facility-administered medications for this visit.    Allergies  Allergen Reactions  . Penicillins Other (See Comments)    Pt. Stated," I just know I am."    Review of Systems:  neg     Physical Exam:    BP 126/80   Pulse 73   Temp (!) 96.9 F (36.1 C)   Ht 5\' 6"  (1.676 m)   Wt 186 lb 2 oz (84.4 kg)   BMI 30.04 kg/m  Filed Weights   12/19/19 0931  Weight: 186 lb 2 oz (84.4 kg)   Constitutional:  Well-developed, in no acute distress. Psychiatric: Normal mood and affect. Behavior is normal. HEENT: Pupils normal.   Conjunctivae are normal. No scleral icterus. Cardiovascular: Normal rate, regular rhythm. No edema Pulmonary/chest: Effort normal and breath sounds normal. No wheezing, rales or rhonchi. Abdominal: Soft, nondistended. Nontender. Bowel sounds active throughout. There are no masses palpable. No hepatomegaly. Rectal:  defered Neurological: Alert and oriented to person place and time. Skin: Skin is warm and dry. No rashes noted.     [name], MD 12/19/2019, 9:50 AM  Cc: Dr. Marco Collie

## 2020-04-02 ENCOUNTER — Other Ambulatory Visit: Payer: Self-pay | Admitting: Gastroenterology

## 2020-04-02 DIAGNOSIS — K50919 Crohn's disease, unspecified, with unspecified complications: Secondary | ICD-10-CM

## 2020-06-25 NOTE — Progress Notes (Signed)
Office Visit Note  Patient: Tammy Carey             Date of Birth: April 11, 1956           MRN: [id number]             PCP: Marco Collie, MD Referring: Marco Collie, MD Visit Date: 07/02/2020 Occupation: @GUAROCC @  Subjective:  Pain in joints.   History of Present Illness: Tammy Carey is a 64 y.o. female seen in consultation per request of Dr. Nyra Capes.  According to the patient she developed a viral illness while she was in early 60s.  Soon after that she started having pain in her elbows and her ankle joints.  She was also found to be anemic and had thrombocytosis.  She had colonoscopy further work-up of anemia.  She states Dr. Lyndel Safe diagnosed her with possible Crohn's disease.  As she was not very symptomatic she did not take any treatment.  She states about 4 months later she started having bowel incontinence and diarrhea.  She was also having lower back discomfort.  She had an abdominal CT which was consistent with Crohn's disease.  At the time she was placed on prednisone and sulfasalazine.  She states she could not tolerate sulfasalazine and was switched to Lialda.  She continues to have flares for approximately 2 years and then she went into remission.  She was off Lialda for 8 years and did well.  In 2018 she was going under a lot of stress and had a flare again.  She states she was hospitalized and was given prednisone.  She presented with abdominal pain and joint pain.  In 2019 she was placed on Humira by Dr. Lyndel Safe.  She states summer 2020 she started having left eye episcleritis and had been having episodes every 8 weeks.  She is being treated with anti-inflammatory eyedrops for that.  She states that in September 2021 she had a severe flare with GI symptoms, pain in her knee joints and her right SI joint.  At the time she was placed on prednisone taper.  She states in October 2021 she developed COVID-19 infection requiring monoclonal antibody infusion.  She was taken off Humira for  about 6 weeks.  She was on prednisone taper.  Then she resumed Humira.  She states she continues to have some discomfort in the right SI joint and her knee joints.  All the discomfort has improved.  She has been off prednisone for 1 week now.  Activities of Daily Living:  Patient reports morning stiffness for 1-2 hours.   Patient Reports nocturnal pain.  Difficulty dressing/grooming: Denies Difficulty climbing stairs: Reports Difficulty getting out of chair: Reports Difficulty using hands for taps, buttons, cutlery, and/or writing: Denies  Review of Systems  Constitutional: Positive for fatigue.  HENT: Negative for mouth sores, mouth dryness and nose dryness.   Eyes: Negative for pain, itching, visual disturbance and dryness.  Respiratory: Negative for cough, hemoptysis, shortness of breath and difficulty breathing.   Cardiovascular: Negative for chest pain, palpitations and swelling in legs/feet.  Gastrointestinal: Positive for diarrhea. Negative for abdominal pain, blood in stool and constipation.  Endocrine: Negative for increased urination.  Genitourinary: Negative for painful urination.  Musculoskeletal: Positive for arthralgias, joint pain and morning stiffness. Negative for joint swelling, myalgias, muscle weakness, muscle tenderness and myalgias.  Skin: Negative for color change, rash, redness and sensitivity to sunlight.  Allergic/Immunologic: Negative for susceptible to infections.  Neurological: Negative for dizziness, numbness, headaches, memory loss  and weakness.  Hematological: Negative for swollen glands.  Psychiatric/Behavioral: Negative for confusion and sleep disturbance.    PMFS History:  Patient Active Problem List   Diagnosis Date Noted  . Hx of migraines 07/02/2020  . History of asthma 07/02/2020  . History of IBS 07/02/2020  . History of diverticulitis 07/02/2020  . Crohn's disease with complication ([place]) 37/62/8315  . Essential hypertension 07/02/2020  .  Episcleritis of left eye 07/02/2020    Past Medical History:  Diagnosis Date  . Anemia   . Asthma   . Crohn's disease ([place])   . Hx of migraine headaches   . Hypertension   . Pulsatile tinnitus     Family History  Problem Relation Age of Onset  . Hypertension Mother   . Hypercholesterolemia Mother   . Asthma Father   . COPD Father   . Prostate cancer Father   . Hypertension Brother   . Hypercholesterolemia Brother   . ADD / ADHD Son   . Rheum arthritis Cousin   . Rheum arthritis Cousin    Past Surgical History:  Procedure Laterality Date  . APPENDECTOMY    . BREAST ENHANCEMENT SURGERY     Per patient - in the 80's, replaced around 2008  . COLONOSCOPY  11/05/2008   Mild colonic diverticulosis. Otherwise normal colonoscopy to TI. Minimal activity by endoscopic criteria.   Marland Kitchen ESOPHAGOGASTRODUODENOSCOPY  11/01/2008   Normal EGD.   Marland Kitchen LAPAROTOMY     for endometriosis   Social History   Social History Narrative  . Not on file   Immunization History  Administered Date(s) Administered  . PFIZER SARS-COV-2 Vaccination 08/23/2019, 09/13/2019     Objective: Vital Signs: BP 137/84 (BP Location: Right Arm, Patient Position: Sitting, Cuff Size: Small)   Pulse 91   Ht 5\' 6"  (1.676 m)   Wt 195 lb 3.2 oz (88.5 kg)   BMI 31.51 kg/m    Physical Exam Vitals and nursing note reviewed.  Constitutional:      Appearance: She is well-developed.  HENT:     Head: Normocephalic and atraumatic.  Eyes:     Conjunctiva/sclera: Conjunctivae normal.  Cardiovascular:     Rate and Rhythm: Normal rate and regular rhythm.     Heart sounds: Normal heart sounds.  Pulmonary:     Effort: Pulmonary effort is normal.     Breath sounds: Normal breath sounds.  Abdominal:     General: Bowel sounds are normal.     Palpations: Abdomen is soft.  Musculoskeletal:     Cervical back: Normal range of motion.  Lymphadenopathy:     Cervical: No cervical adenopathy.  Skin:    General: Skin is warm  and dry.     Capillary Refill: Capillary refill takes less than 2 seconds.  Neurological:     Mental Status: She is alert and oriented to person, place, and time.  Psychiatric:        Behavior: Behavior normal.      Musculoskeletal Exam: C-spine thoracic and lumbar spine with good range of motion.  She tenderness on palpation of right SI joint.  Shoulder joints, elbow joints, wrist joints, MCPs PIPs and DIPs with good range of motion with no synovitis.  She is some thickening of the DIP joints but no synovitis.  Hip joints, knee joints, ankles, MTPs and PIPs with good range of motion.  She has slight warmth on palpation of her right knee joint.  CDAI Exam: CDAI Score: -- Patient Global: --; Provider Global: --  Swollen: --; Tender: -- Joint Exam 07/02/2020   No joint exam has been documented for this visit   There is currently no information documented on the homunculus. Go to the Rheumatology activity and complete the homunculus joint exam.  Investigation: No additional findings.  Imaging: No results found.  Recent Labs: Lab Results  Component Value Date   WBC 9.2 06/27/2018   HGB 12.9 06/27/2018   PLT 411.0 (H) 06/27/2018   NA 139 06/27/2018   K 4.1 06/27/2018   CL 102 06/27/2018   CO2 29 06/27/2018   GLUCOSE 125 (H) 06/27/2018   BUN 13 06/27/2018   CREATININE 0.70 06/27/2018   BILITOT 0.4 06/27/2018   ALKPHOS 37 (L) 06/27/2018   AST 14 06/27/2018   ALT 14 06/27/2018   PROT 7.0 06/27/2018   ALBUMIN 4.1 06/27/2018   CALCIUM 9.1 06/27/2018   QFTBGOLDPLUS NEGATIVE 06/27/2018  June 27, 2018 TPMT normal, hepatitis B-  Speciality Comments: No specialty comments available.  Procedures:  No procedures performed Allergies: Penicillins   Assessment / Plan:     Visit Diagnoses: Crohn's disease with complication, unspecified gastrointestinal tract location [facility]) - On lialda and humira by Dr. Lyndel Safe.  She had a recent flare of Crohn's disease since September 2021.  She  was treated with prednisone taper.  High risk medication use - Humira 40 mg subcu every other week.- Plan: DG Chest 2 View, CBC with Differential/Platelet, COMPLETE METABOLIC PANEL WITH GFR, Hepatitis C antibody, HIV Antibody (routine testing w rflx), QuantiFERON-TB Gold Plus, Serum protein electrophoresis with reflex, IgG, IgA, IgM  Sacroiliitis, not elsewhere classified ([place]) -she has been having recurrent pain in the right SI joint.  She had tenderness on palpation of her right SI joint today.  She has morning stiffness.  Chronic SI joint pain - Plan: XR Pelvis 1-2 Views  Chronic pain of both knees -she complains of pain and discomfort in her bilateral knee joints.  She had warmth on palpation of her right knee joint.  Plan: XR KNEE 3 VIEW RIGHT, XR KNEE 3 VIEW LEFT, Sedimentation rate, ANA, Rheumatoid factor, Cyclic citrul peptide antibody, IgG  Episcleritis of left eye-she has had recurrent epi scleritis since summer 2020.  She states she has 1 episode every 8 weeks.  I will refer her to ophthalmology.  I discussed the option of adding methotrexate to Humira.  It would be helpful for her eye symptoms and also her joint symptoms.  Indication side effects contraindications were discussed and a handout was given.  I will wait on the lab results to be available prior to starting her on the medication.  Other fatigue - Plan: CK  History of diverticulitis-in the past.  History of IBS-she gets frequent diarrhea.  History of asthma  Hx of migraines  Essential hypertension  Stress - On Cymbalta.  COVID-19 infection-patient had COVID-19 infection October 2021.  She received a monoclonal antibodies.  She states she did not have any major complications.  She is planning to get a booster after 90 days.  Orders: Orders Placed This Encounter  Procedures  . XR KNEE 3 VIEW RIGHT  . XR KNEE 3 VIEW LEFT  . XR Pelvis 1-2 Views  . DG Chest 2 View  . CBC with Differential/Platelet  . COMPLETE  METABOLIC PANEL WITH GFR  . CK  . Sedimentation rate  . Hepatitis C antibody  . HIV Antibody (routine testing w rflx)  . QuantiFERON-TB Gold Plus  . Serum protein electrophoresis with reflex  . IgG,  IgA, IgM  . ANA  . Rheumatoid factor  . Cyclic citrul peptide antibody, IgG   No orders of the defined types were placed in this encounter.   Follow-Up Instructions: Return for Crohn's disease.   Bo Merino, MD  Note - This record has been created using Editor, commissioning.  Chart creation errors have been sought, but may not always  have been located. Such creation errors do not reflect on  the standard of medical care.

## 2020-07-02 ENCOUNTER — Ambulatory Visit: Payer: BC Managed Care – PPO | Admitting: Rheumatology

## 2020-07-02 ENCOUNTER — Ambulatory Visit: Payer: Self-pay

## 2020-07-02 ENCOUNTER — Other Ambulatory Visit: Payer: Self-pay

## 2020-07-02 ENCOUNTER — Encounter: Payer: Self-pay | Admitting: Rheumatology

## 2020-07-02 VITALS — BP 137/84 | HR 91 | Ht 66.0 in | Wt 195.2 lb

## 2020-07-02 DIAGNOSIS — H15102 Unspecified episcleritis, left eye: Secondary | ICD-10-CM

## 2020-07-02 DIAGNOSIS — K50919 Crohn's disease, unspecified, with unspecified complications: Secondary | ICD-10-CM | POA: Diagnosis not present

## 2020-07-02 DIAGNOSIS — R5383 Other fatigue: Secondary | ICD-10-CM

## 2020-07-02 DIAGNOSIS — H93A9 Pulsatile tinnitus, unspecified ear: Secondary | ICD-10-CM | POA: Insufficient documentation

## 2020-07-02 DIAGNOSIS — G8929 Other chronic pain: Secondary | ICD-10-CM

## 2020-07-02 DIAGNOSIS — U071 COVID-19: Secondary | ICD-10-CM

## 2020-07-02 DIAGNOSIS — Z862 Personal history of diseases of the blood and blood-forming organs and certain disorders involving the immune mechanism: Secondary | ICD-10-CM

## 2020-07-02 DIAGNOSIS — Z8719 Personal history of other diseases of the digestive system: Secondary | ICD-10-CM

## 2020-07-02 DIAGNOSIS — M25561 Pain in right knee: Secondary | ICD-10-CM

## 2020-07-02 DIAGNOSIS — Z8669 Personal history of other diseases of the nervous system and sense organs: Secondary | ICD-10-CM

## 2020-07-02 DIAGNOSIS — I10 Essential (primary) hypertension: Secondary | ICD-10-CM

## 2020-07-02 DIAGNOSIS — M25562 Pain in left knee: Secondary | ICD-10-CM

## 2020-07-02 DIAGNOSIS — Z79899 Other long term (current) drug therapy: Secondary | ICD-10-CM

## 2020-07-02 DIAGNOSIS — F439 Reaction to severe stress, unspecified: Secondary | ICD-10-CM

## 2020-07-02 DIAGNOSIS — Z8709 Personal history of other diseases of the respiratory system: Secondary | ICD-10-CM

## 2020-07-02 DIAGNOSIS — M533 Sacrococcygeal disorders, not elsewhere classified: Secondary | ICD-10-CM | POA: Diagnosis not present

## 2020-07-02 DIAGNOSIS — M461 Sacroiliitis, not elsewhere classified: Secondary | ICD-10-CM | POA: Diagnosis not present

## 2020-07-02 NOTE — Patient Instructions (Addendum)
Methotrexate injection What is this medicine? METHOTREXATE (METH oh TREX ate) is a chemotherapy drug used to treat cancer including breast cancer, leukemia, and lymphoma. This medicine can also be used to treat psoriasis and certain kinds of arthritis. This medicine may be used for other purposes; ask your health care provider or pharmacist if you have questions. What should I tell my health care provider before I take this medicine? They need to know if you have any of these conditions:  fluid in the stomach area or lungs  if you often drink alcohol  infection or immune system problems  kidney disease  liver disease  low blood counts, like low white cell, platelet, or red cell counts  lung disease  radiation therapy  stomach ulcers  ulcerative colitis  an unusual or allergic reaction to methotrexate, other medicines, foods, dyes, or preservatives  pregnant or trying to get pregnant  breast-feeding How should I use this medicine? This medicine is for infusion into a vein or for injection into muscle or into the spinal fluid (whichever applies). It is usually given by a health care professional in a hospital or clinic setting. In rare cases, you might get this medicine at home. You will be taught how to give this medicine. Use exactly as directed. Take your medicine at regular intervals. Do not take your medicine more often than directed. If this medicine is used for arthritis or psoriasis, it should be taken weekly, NOT daily. It is important that you put your used needles and syringes in a special sharps container. Do not put them in a trash can. If you do not have a sharps container, call your pharmacist or healthcare provider to get one. Talk to your pediatrician regarding the use of this medicine in children. While this drug may be prescribed for children as young as 2 years for selected conditions, precautions do apply. Overdosage: If you think you have taken too much of  this medicine contact a poison control center or emergency room at once. NOTE: This medicine is only for you. Do not share this medicine with others. What if I miss a dose? It is important not to miss your dose. Call your doctor or health care professional if you are unable to keep an appointment. If you give yourself the medicine and you miss a dose, talk with your doctor or health care professional. Do not take double or extra doses. What may interact with this medicine? This medicine may interact with the following medications:  acitretin  aspirin or aspirin-like medicines including salicylates  azathioprine  certain antibiotics like chloramphenicol, penicillin, tetracycline  certain medicines for stomach problems like esomeprazole, omeprazole, pantoprazole  cyclosporine  gold  hydroxychloroquine  live virus vaccines  mercaptopurine  NSAIDs, medicines for pain and inflammation, like ibuprofen or naproxen  other cytotoxic agents  penicillamine  phenylbutazone  phenytoin  probenacid  retinoids such as isotretinoin and tretinoin  steroid medicines like prednisone or cortisone  sulfonamides like sulfasalazine and trimethoprim/sulfamethoxazole  theophylline This list may not describe all possible interactions. Give your health care provider a list of all the medicines, herbs, non-prescription drugs, or dietary supplements you use. Also tell them if you smoke, drink alcohol, or use illegal drugs. Some items may interact with your medicine. What should I watch for while using this medicine? Avoid alcoholic drinks. In some cases, you may be given additional medicines to help with side effects. Follow all directions for their use. This medicine can make you more sensitive to  the sun. Keep out of the sun. If you cannot avoid being in the sun, wear protective clothing and use sunscreen. Do not use sun lamps or tanning beds/booths. You may get drowsy or dizzy. Do not drive,  use machinery, or do anything that needs mental alertness until you know how this medicine affects you. Do not stand or sit up quickly, especially if you are an older patient. This reduces the risk of dizzy or fainting spells. You may need blood work done while you are taking this medicine. Call your doctor or health care professional for advice if you get a fever, chills or sore throat, or other symptoms of a cold or flu. Do not treat yourself. This drug decreases your body's ability to fight infections. Try to avoid being around people who are sick. This medicine may increase your risk to bruise or bleed. Call your doctor or health care professional if you notice any unusual bleeding. Check with your doctor or health care professional if you get an attack of severe diarrhea, nausea and vomiting, or if you sweat a lot. The loss of too much body fluid can make it dangerous for you to take this medicine. Talk to your doctor about your risk of cancer. You may be more at risk for certain types of cancers if you take this medicine. Both men and women must use effective birth control with this medicine. Do not become pregnant while taking this medicine or until at least 1 normal menstrual cycle has occurred after stopping it. Women should inform their doctor if they wish to become pregnant or think they might be pregnant. Men should not father a child while taking this medicine and for 3 months after stopping it. There is a potential for serious side effects to an unborn child. Talk to your health care professional or pharmacist for more information. Do not breast-feed an infant while taking this medicine. What side effects may I notice from receiving this medicine? Side effects that you should report to your doctor or health care professional as soon as possible:  allergic reactions like skin rash, itching or hives, swelling of the face, lips, or tongue  back pain  breathing problems or shortness of  breath  confusion  diarrhea  dry, nonproductive cough  low blood counts - this medicine may decrease the number of white blood cells, red blood cells and platelets. You may be at increased risk of infections and bleeding  mouth sores  redness, blistering, peeling or loosening of the skin, including inside the mouth  seizures  severe headaches  signs of infection - fever or chills, cough, sore throat, pain or difficulty passing urine  signs and symptoms of bleeding such as bloody or black, tarry stools; red or dark-brown urine; spitting up blood or brown material that looks like coffee grounds; red spots on the skin; unusual bruising or bleeding from the eye, gums, or nose  signs and symptoms of kidney injury like trouble passing urine or change in the amount of urine  signs and symptoms of liver injury like dark yellow or brown urine; general ill feeling or flu-like symptoms; light-colored stools; loss of appetite; nausea; right upper belly pain; unusually weak or tired; yellowing of the eyes or skin  stiff neck  vomiting Side effects that usually do not require medical attention (report to your doctor or health care professional if they continue or are bothersome):  dizziness  hair loss  headache  stomach pain  upset stomach This list  may not describe all possible side effects. Call your doctor for medical advice about side effects. You may report side effects to FDA at 1-800-FDA-1088. Where should I keep my medicine? If you are using this medicine at home, you will be instructed on how to store this medicine. Throw away any unused medicine after the expiration date on the label. NOTE: This sheet is a summary. It may not cover all possible information. If you have questions about this medicine, talk to your doctor, pharmacist, or health care provider.  2020 [employer]vier/Gold Standard (2017-03-11 13:31:42)

## 2020-07-05 LAB — COMPLETE METABOLIC PANEL WITH GFR
AG Ratio: 1.4 (calc) (ref 1.0–2.5)
ALT: 11 U/L (ref 6–29)
AST: 14 U/L (ref 10–35)
Albumin: 4.1 g/dL (ref 3.6–5.1)
Alkaline phosphatase (APISO): 48 U/L (ref 37–153)
BUN: 14 mg/dL (ref 7–25)
CO2: 23 mmol/L (ref 20–32)
Calcium: 9.3 mg/dL (ref 8.6–10.4)
Chloride: 101 mmol/L (ref 98–110)
Creat: 0.76 mg/dL (ref 0.50–0.99)
GFR, Est African American: 97 mL/min/{1.73_m2} (ref >=60)
GFR, Est Non African American: 83 mL/min/{1.73_m2} (ref >=60)
Globulin: 2.9 g/dL (calc) (ref 1.9–3.7)
Glucose, Bld: 98 mg/dL (ref 65–139)
Potassium: 4.9 mmol/L (ref 3.5–5.3)
Sodium: 138 mmol/L (ref 135–146)
Total Bilirubin: 0.3 mg/dL (ref 0.2–1.2)
Total Protein: 7 g/dL (ref 6.1–8.1)

## 2020-07-05 LAB — CBC WITH DIFFERENTIAL/PLATELET
Absolute Monocytes: 612 cells/uL (ref 200–950)
Basophils Absolute: 99 cells/uL (ref 0–200)
Basophils Relative: 1.1 %
Eosinophils Absolute: 144 cells/uL (ref 15–500)
Eosinophils Relative: 1.6 %
HCT: 40.6 % (ref 35.0–45.0)
Hemoglobin: 13.7 g/dL (ref 11.7–15.5)
Lymphs Abs: 2880 cells/uL (ref 850–3900)
MCH: 30.6 pg (ref 27.0–33.0)
MCHC: 33.7 g/dL (ref 32.0–36.0)
MCV: 90.8 fL (ref 80.0–100.0)
MPV: 9.8 fL (ref 7.5–12.5)
Monocytes Relative: 6.8 %
Neutro Abs: 5265 cells/uL (ref 1500–7800)
Neutrophils Relative %: 58.5 %
Platelets: 578 10*3/uL — ABNORMAL HIGH (ref 140–400)
RBC: 4.47 10*6/uL (ref 3.80–5.10)
RDW: 13.1 % (ref 11.0–15.0)
Total Lymphocyte: 32 %
WBC: 9 10*3/uL (ref 3.8–10.8)

## 2020-07-05 LAB — PROTEIN ELECTROPHORESIS, SERUM, WITH REFLEX
Albumin ELP: 4.1 g/dL (ref 3.8–4.8)
Alpha 1: 0.4 g/dL — ABNORMAL HIGH (ref 0.2–0.3)
Alpha 2: 0.9 g/dL (ref 0.5–0.9)
Beta 2: 0.3 g/dL (ref 0.2–0.5)
Beta Globulin: 0.6 g/dL (ref 0.4–0.6)
Gamma Globulin: 0.9 g/dL (ref 0.8–1.7)
Total Protein: 7.1 g/dL (ref 6.1–8.1)

## 2020-07-05 LAB — QUANTIFERON-TB GOLD PLUS
Mitogen-NIL: >10 IU/mL
NIL: 0.02 IU/mL
QuantiFERON-TB Gold Plus: NEGATIVE
TB1-NIL: 0 IU/mL
TB2-NIL: 0 IU/mL

## 2020-07-05 LAB — RHEUMATOID FACTOR: Rheumatoid fact SerPl-aCnc: <14 IU/mL (ref <14)

## 2020-07-05 LAB — CYCLIC CITRUL PEPTIDE ANTIBODY, IGG: Cyclic Citrullin Peptide Ab: <16 UNITS

## 2020-07-05 LAB — IGG, IGA, IGM
IgG (Immunoglobin G), Serum: 928 mg/dL (ref 600–1540)
IgM, Serum: 154 mg/dL (ref 50–300)
Immunoglobulin A: 157 mg/dL (ref 70–320)

## 2020-07-05 LAB — CK: Total CK: 55 U/L (ref 29–143)

## 2020-07-05 LAB — HEPATITIS C ANTIBODY
Hepatitis C Ab: NONREACTIVE
SIGNAL TO CUT-OFF: 0.01 (ref <1.00)

## 2020-07-05 LAB — HIV ANTIBODY (ROUTINE TESTING W REFLEX): HIV 1&2 Ab, 4th Generation: NONREACTIVE

## 2020-07-05 LAB — SEDIMENTATION RATE: Sed Rate: 48 mm/h — ABNORMAL HIGH (ref 0–30)

## 2020-07-05 LAB — ANA: Anti Nuclear Antibody (ANA): NEGATIVE

## 2020-07-05 NOTE — Progress Notes (Signed)
I will discuss results at the follow-up visit.

## 2020-07-17 ENCOUNTER — Other Ambulatory Visit: Payer: Self-pay

## 2020-07-17 ENCOUNTER — Ambulatory Visit (HOSPITAL_COMMUNITY)
Admission: RE | Admit: 2020-07-17 | Discharge: 2020-07-17 | Disposition: A | Discharge status: Home / Self Care | Payer: BC Managed Care – PPO | Source: Ambulatory Visit | Attending: Rheumatology | Admitting: Rheumatology

## 2020-07-17 DIAGNOSIS — Z79899 Other long term (current) drug therapy: Secondary | ICD-10-CM | POA: Insufficient documentation

## 2020-07-18 ENCOUNTER — Other Ambulatory Visit: Payer: Self-pay | Admitting: *Deleted

## 2020-07-18 DIAGNOSIS — R911 Solitary pulmonary nodule: Secondary | ICD-10-CM

## 2020-07-18 NOTE — Progress Notes (Signed)
Please notify patient that there is a 10 mm nodule in the right lung base.  We will schedule CT scan of the chest per the recommendation of the radiologist.

## 2020-07-25 ENCOUNTER — Ambulatory Visit: Payer: BC Managed Care – PPO | Admitting: Rheumatology

## 2020-07-30 ENCOUNTER — Ambulatory Visit (HOSPITAL_COMMUNITY): Payer: BC Managed Care – PPO

## 2020-08-02 ENCOUNTER — Other Ambulatory Visit: Payer: Self-pay

## 2020-08-02 ENCOUNTER — Ambulatory Visit (HOSPITAL_COMMUNITY)
Admission: RE | Admit: 2020-08-02 | Discharge: 2020-08-02 | Disposition: A | Discharge status: Home / Self Care | Payer: BC Managed Care – PPO | Source: Ambulatory Visit | Attending: Rheumatology | Admitting: Rheumatology

## 2020-08-02 DIAGNOSIS — R911 Solitary pulmonary nodule: Secondary | ICD-10-CM | POA: Diagnosis not present

## 2020-08-03 NOTE — Progress Notes (Signed)
Office Visit Note  Patient: Tammy Carey             Date of Birth: May 31, 1956           MRN: [id number]             PCP: Marco Collie, MD Referring: Marco Collie, MD Visit Date: 08/06/2020 Occupation: @GUAROCC @  Subjective:  Lower back pain.   History of Present Illness: Tammy Carey is a 65 y.o. female with history of Crohn's disease, episcleritis and chronic SI joint pain.  She states she has been on prednisone since April 17, 2020.  She was unable to taper prednisone and was given another taper starting at 40 mg p.o. daily.  She is currently taking prednisone 20 mg daily.  She continues to have episcleritis.  Her GI symptoms are better.  She states her knee joint pain has improved but she continues to have some SI joint discomfort.  Activities of Daily Living:  Patient reports morning stiffness for 0 minutes.   Patient Denies nocturnal pain.  Difficulty dressing/grooming: Denies Difficulty climbing stairs: Reports Difficulty getting out of chair: Denies Difficulty using hands for taps, buttons, cutlery, and/or writing: Denies  Review of Systems  Constitutional: Positive for fatigue.  HENT: Negative for mouth sores, mouth dryness and nose dryness.   Eyes: Negative for pain, redness, itching, visual disturbance and dryness.  Respiratory: Negative for shortness of breath and difficulty breathing.   Cardiovascular: Negative for chest pain and palpitations.  Gastrointestinal: Negative for blood in stool, constipation and diarrhea.  Endocrine: Negative for increased urination.  Genitourinary: Negative for difficulty urinating.  Musculoskeletal: Negative for arthralgias, joint pain, joint swelling, myalgias, morning stiffness, muscle tenderness and myalgias.  Skin: Negative for color change, rash and redness.  Allergic/Immunologic: Negative for susceptible to infections.  Neurological: Positive for headaches. Negative for dizziness, numbness, memory loss and weakness.   Hematological: Negative for bruising/bleeding tendency.  Psychiatric/Behavioral: Negative for confusion.    PMFS History:  Patient Active Problem List   Diagnosis Date Noted  . Hx of migraines 07/02/2020  . History of asthma 07/02/2020  . History of IBS 07/02/2020  . History of diverticulitis 07/02/2020  . Crohn's disease with complication ([place]) 41/10/129  . Essential hypertension 07/02/2020  . Episcleritis of left eye 07/02/2020    Past Medical History:  Diagnosis Date  . Anemia   . Asthma   . Crohn's disease ([place])   . Hx of migraine headaches   . Hypertension   . Pulsatile tinnitus     Family History  Problem Relation Age of Onset  . Hypertension Mother   . Hypercholesterolemia Mother   . Asthma Father   . COPD Father   . Prostate cancer Father   . Hypertension Brother   . Hypercholesterolemia Brother   . ADD / ADHD Son   . Rheum arthritis Cousin   . Rheum arthritis Cousin    Past Surgical History:  Procedure Laterality Date  . APPENDECTOMY    . BREAST ENHANCEMENT SURGERY     Per patient - in the 80's, replaced around 2008  . COLONOSCOPY  11/05/2008   Mild colonic diverticulosis. Otherwise normal colonoscopy to TI. Minimal activity by endoscopic criteria.   Marland Kitchen ESOPHAGOGASTRODUODENOSCOPY  11/01/2008   Normal EGD.   Marland Kitchen LAPAROTOMY     for endometriosis   Social History   Social History Narrative  . Not on file   Immunization History  Administered Date(s) Administered  . PFIZER SARS-COV-2 Vaccination 08/23/2019, 09/13/2019  Objective: Vital Signs: BP (!) 149/79 (BP Location: Left Arm, Patient Position: Sitting, Cuff Size: Normal)   Pulse 81   Resp 16   Ht 5' 6"  (1.676 m)   Wt 199 lb 12.8 oz (90.6 kg)   BMI 32.25 kg/m    Physical Exam Vitals and nursing note reviewed.  Constitutional:      Appearance: She is well-developed and well-nourished.  HENT:     Head: Normocephalic and atraumatic.  Eyes:     Extraocular Movements: EOM normal.      Conjunctiva/sclera: Conjunctivae normal.  Cardiovascular:     Rate and Rhythm: Normal rate and regular rhythm.     Pulses: Intact distal pulses.     Heart sounds: Normal heart sounds.  Pulmonary:     Effort: Pulmonary effort is normal.     Breath sounds: Normal breath sounds.  Abdominal:     General: Bowel sounds are normal.     Palpations: Abdomen is soft.  Musculoskeletal:     Cervical back: Normal range of motion.  Lymphadenopathy:     Cervical: No cervical adenopathy.  Skin:    General: Skin is warm and dry.     Capillary Refill: Capillary refill takes less than 2 seconds.  Neurological:     Mental Status: She is alert and oriented to person, place, and time.  Psychiatric:        Mood and Affect: Mood and affect normal.        Behavior: Behavior normal.      Musculoskeletal Exam: C-spine was in good range of motion.  Shoulder joints, elbow joints, wrist joints, MCPs PIPs and DIPs with good range of motion with no synovitis.  She had no tenderness over lumbar spine or SI joints.  Hip joints, knee joints, ankles, MTPs and PIPs with good range of motion with no synovitis.  She had no Achilles tendinitis or plantar fasciitis.  CDAI Exam: CDAI Score: -- Patient Global: --; Provider Global: -- Swollen: --; Tender: -- Joint Exam 08/06/2020   No joint exam has been documented for this visit   There is currently no information documented on the homunculus. Go to the Rheumatology activity and complete the homunculus joint exam.  Investigation: No additional findings.  Imaging: DG Chest 2 View  Result Date: 07/18/2020 CLINICAL DATA:  Immunosuppressive therapy EXAM: CHEST - 2 VIEW COMPARISON:  08/24/2016 FINDINGS: The heart size and mediastinal contours are within normal limits. Rounded 10 mm nodular density projects within the inferomedial aspect of the right lung base. Lungs are otherwise clear. No pleural effusion or pneumothorax. The visualized skeletal structures are  unremarkable. IMPRESSION: 1. No acute cardiopulmonary findings. 2. Rounded 10 mm nodular density projects within the inferomedial aspect of the right lung base. Recommend CT of the chest for further evaluation. Electronically Signed   By: Davina Poke D.O.   On: 07/18/2020 08:01   CT CHEST WO CONTRAST  Result Date: 08/02/2020 CLINICAL DATA:  Lung nodule. EXAM: CT CHEST WITHOUT CONTRAST TECHNIQUE: Multidetector CT imaging of the chest was performed following the standard protocol without IV contrast. COMPARISON:  July 17, 2020. FINDINGS: Cardiovascular: No significant vascular findings. Normal heart size. No pericardial effusion. Mediastinum/Nodes: 1.5 cm left thyroid nodule is noted. Esophagus is unremarkable. No adenopathy is noted. Lungs/Pleura: Lungs are clear. No pleural effusion or pneumothorax. Upper Abdomen: Multiple probable hepatic cysts are noted. Musculoskeletal: No chest wall mass or suspicious bone lesions identified. IMPRESSION: 1. 1.5 cm left thyroid nodule is noted. Recommend thyroid US. (Ref:  J Am Coll Radiol. 2015 Feb;12(2): 143-50). 2. Multiple probable hepatic cysts are noted. 3. No other significant abnormality seen in the chest. Electronically Signed   By: Marijo Conception M.D.   On: 08/02/2020 11:37    Recent Labs: Lab Results  Component Value Date   WBC 9.0 07/02/2020   HGB 13.7 07/02/2020   PLT 578 (H) 07/02/2020   NA 138 07/02/2020   K 4.9 07/02/2020   CL 101 07/02/2020   CO2 23 07/02/2020   GLUCOSE 98 07/02/2020   BUN 14 07/02/2020   CREATININE 0.76 07/02/2020   BILITOT 0.3 07/02/2020   ALKPHOS 37 (L) 06/27/2018   AST 14 07/02/2020   ALT 11 07/02/2020   PROT 7.0 07/02/2020   PROT 7.1 07/02/2020   ALBUMIN 4.1 06/27/2018   CALCIUM 9.3 07/02/2020   GFRAA 97 07/02/2020   QFTBGOLDPLUS NEGATIVE 07/02/2020   November 30 /2021 SPEP alpha-1 globulin increase, TB Gold negative, immunoglobulins normal, hepatitis C-, HIV negative, CK 55, ESR 48, ANA negative, RF  negative, anti-CCP negative  Speciality Comments: No specialty comments available.  Procedures:  No procedures performed Allergies: Penicillins   Assessment / Plan:     Visit Diagnoses: Crohn's disease with complication, unspecified gastrointestinal tract location [facility]) - Treated with Lialda in the past and now on Humira by Dr. Lyndel Safe.  Patient states she has been having flare of Crohn's disease and also episcleritis.  She has been on prednisone since September.  She had a recent taper of prednisone again and her symptoms of Crohn's disease has improved.  Her joint symptoms have improved as well.  High risk medication use - Humira 40 mg subcu every 14 days.  Treatment was interrupted for 6 weeks in October due to COVID-19 infection.  Discussed adding methotrexate at the last visit.  We had detailed discussion regarding indications side effects contraindications of methotrexate.  Patient was in agreement and wants to proceed with methotrexate.  Handout was given and consent was taken.  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  Her dose will be methotrexate 0.6 mL subcu weekly for 2 weeks.  She will increase the dose to methotrexate 0.8 mL subcu weekly.  Folic acid 1 mg tablet 2 tablets p.o. daily.  We will get labs 2 weeks x 2 and then every 2 months.  If labs are stable we can check labs every 3 months.  We also discussed obtaining antiadalimumab antibodies in the future.  Drug Counseling TB Gold: July 02, 2020 Hepatitis panel: Hepatitis C July 02, 2020, hepatitis B June 27, 2018  Chest-xray: July 18, 2020  Contraception: Postmenopausal  Alcohol use: Occasional  Patient was counseled on the purpose, proper use, and adverse effects of methotrexate including nausea, infection, and signs and symptoms of pneumonitis.  Reviewed instructions with patient to take methotrexate weekly along with folic acid daily.  Discussed the importance of  frequent monitoring of kidney and liver function and blood counts, and provided patient with standing lab instructions.  Counseled patient to avoid NSAIDs and alcohol while on methotrexate.  Provided patient with educational materials on methotrexate and answered all questions.  Advised patient to get annual influenza vaccine and to get a pneumococcal vaccine if patient has not already had one.  Patient voiced understanding.  Patient consented to methotrexate use.  Will upload into chart.    Sacroiliitis, not elsewhere classified ([place]) - Chronic SI joint pain.  Bilateral SI joint sclerosis.  She  is currently not having SI joint pain since she has been on prednisone.  Chronic SI joint pain  Primary osteoarthritis of both knees - Bilateral moderate osteoarthritis and mild chondromalacia patella.  She is off-and-on discomfort.  Episcleritis of left eye - Episcleritis every 8 weeks since 2020.  She was referred to ophthalmology.  Thrombocytosis-we will continue to monitor.  Thyroid nodule - Incidentally noted on the imaging study.  Patient states she will be getting ultrasound to Dr. Red Christians office.  Hepatic cyst - Multiple probable hepatic cysts were noted on CT chest.  She will discuss this further with Dr. Lyndel Safe.  Other fatigue - CK is normal.  Other medical problems are listed as follows:  History of diverticulitis  History of IBS  History of asthma  Essential hypertension  Hx of migraines  Stress  COVID-19 virus infection - October 2021.  She received monoclonal antibody infusion.  Use of booster was discussed.  Instructions were placed in the AVS.  Orders: No orders of the defined types were placed in this encounter.  Meds ordered this encounter  Medications  . methotrexate 50 MG/2ML injection    Sig: Inject 0.63m into the skin once weekly for 2 weeks, if labs are stable increase to 0.872minto the skin once weekly.    Dispense:  4 mL    Refill:  0  . folic acid (FOLVITE)  1 MG tablet    Sig: Take 2 tablets (2 mg total) by mouth daily.    Dispense:  180 tablet    Refill:  2  . Tuberculin-Allergy Syringes 27G X 1/2" 1 ML MISC    Sig: Use 1 syringe once weekly to inject methotrexate.    Dispense:  12 each    Refill:  3      Follow-Up Instructions: Return in about 6 weeks (around 09/17/2020) for Crohn's disease.   ShBo MerinoMD  Note - This record has been created using DrEditor, commissioning Chart creation errors have been sought, but may not always  have been located. Such creation errors do not reflect on  the standard of medical care.

## 2020-08-03 NOTE — Progress Notes (Signed)
CT chest is negative. Thyroid nodule was noted. She will need ultrasound of the thyroid to evaluate this further. Multiple hepatic cysts were also noted.  Please forward this note to her PCP. Please refer her to endocrinology and gastroenterology for evaluation.

## 2020-08-05 ENCOUNTER — Telehealth: Payer: Self-pay | Admitting: *Deleted

## 2020-08-05 NOTE — Telephone Encounter (Signed)
Patient already sees GI, Dr. Chales Abrahams. A copy of CT scan has been sent to Dr. Chales Abrahams. Patient states she will go see her PCP for the ultrasound of thyroid and will discuss with her prior to an endocrinology referral.

## 2020-08-05 NOTE — Telephone Encounter (Signed)
-----   Message from Pollyann Savoy, MD sent at 08/03/2020  9:46 AM EST ----- CT chest is negative. Thyroid nodule was noted. She will need ultrasound of the thyroid to evaluate this further. Multiple hepatic cysts were also noted.  Please forward this note to her PCP. Please refer her to endocrinology and gastroenterology for  evaluation.

## 2020-08-06 ENCOUNTER — Encounter: Payer: Self-pay | Admitting: Rheumatology

## 2020-08-06 ENCOUNTER — Other Ambulatory Visit: Payer: Self-pay

## 2020-08-06 ENCOUNTER — Ambulatory Visit: Payer: BC Managed Care – PPO | Admitting: Rheumatology

## 2020-08-06 VITALS — BP 149/79 | HR 81 | Resp 16 | Ht 66.0 in | Wt 199.8 lb

## 2020-08-06 DIAGNOSIS — E041 Nontoxic single thyroid nodule: Secondary | ICD-10-CM

## 2020-08-06 DIAGNOSIS — M461 Sacroiliitis, not elsewhere classified: Secondary | ICD-10-CM | POA: Diagnosis not present

## 2020-08-06 DIAGNOSIS — U071 COVID-19: Secondary | ICD-10-CM

## 2020-08-06 DIAGNOSIS — G8929 Other chronic pain: Secondary | ICD-10-CM

## 2020-08-06 DIAGNOSIS — F439 Reaction to severe stress, unspecified: Secondary | ICD-10-CM

## 2020-08-06 DIAGNOSIS — M170 Bilateral primary osteoarthritis of knee: Secondary | ICD-10-CM

## 2020-08-06 DIAGNOSIS — K7689 Other specified diseases of liver: Secondary | ICD-10-CM

## 2020-08-06 DIAGNOSIS — Z8669 Personal history of other diseases of the nervous system and sense organs: Secondary | ICD-10-CM

## 2020-08-06 DIAGNOSIS — D75839 Thrombocytosis, unspecified: Secondary | ICD-10-CM

## 2020-08-06 DIAGNOSIS — Z8709 Personal history of other diseases of the respiratory system: Secondary | ICD-10-CM

## 2020-08-06 DIAGNOSIS — M533 Sacrococcygeal disorders, not elsewhere classified: Secondary | ICD-10-CM | POA: Diagnosis not present

## 2020-08-06 DIAGNOSIS — H15102 Unspecified episcleritis, left eye: Secondary | ICD-10-CM | POA: Diagnosis not present

## 2020-08-06 DIAGNOSIS — K50919 Crohn's disease, unspecified, with unspecified complications: Secondary | ICD-10-CM | POA: Diagnosis not present

## 2020-08-06 DIAGNOSIS — R5383 Other fatigue: Secondary | ICD-10-CM

## 2020-08-06 DIAGNOSIS — Z8719 Personal history of other diseases of the digestive system: Secondary | ICD-10-CM

## 2020-08-06 DIAGNOSIS — Z79899 Other long term (current) drug therapy: Secondary | ICD-10-CM | POA: Diagnosis not present

## 2020-08-06 DIAGNOSIS — I10 Essential (primary) hypertension: Secondary | ICD-10-CM

## 2020-08-06 MED ORDER — FOLIC ACID 1 MG PO TABS: 2.0000 mg | ORAL_TABLET | Freq: Every day | ORAL | 2 refills | Status: DC

## 2020-08-06 MED ORDER — "TUBERCULIN-ALLERGY SYRINGES 27G X 1/2"" 1 ML MISC": 3 refills | Status: DC

## 2020-08-06 MED ORDER — METHOTREXATE SODIUM CHEMO INJECTION 50 MG/2ML: INTRAMUSCULAR | 0 refills | Status: DC

## 2020-08-06 NOTE — Patient Instructions (Addendum)
Methotrexate injection What is this medicine? METHOTREXATE (METH oh TREX ate) is a chemotherapy drug used to treat cancer including breast cancer, leukemia, and lymphoma. This medicine can also be used to treat psoriasis and certain kinds of arthritis. This medicine may be used for other purposes; ask your health care provider or pharmacist if you have questions. What should I tell my health care provider before I take this medicine? They need to know if you have any of these conditions:  fluid in the stomach area or lungs  if you often drink alcohol  infection or immune system problems  kidney disease  liver disease  low blood counts, like low white cell, platelet, or red cell counts  lung disease  radiation therapy  stomach ulcers  ulcerative colitis  an unusual or allergic reaction to methotrexate, other medicines, foods, dyes, or preservatives  pregnant or trying to get pregnant  breast-feeding How should I use this medicine? This medicine is for infusion into a vein or for injection into muscle or into the spinal fluid (whichever applies). It is usually given by a health care professional in a hospital or clinic setting. In rare cases, you might get this medicine at home. You will be taught how to give this medicine. Use exactly as directed. Take your medicine at regular intervals. Do not take your medicine more often than directed. If this medicine is used for arthritis or psoriasis, it should be taken weekly, NOT daily. It is important that you put your used needles and syringes in a special sharps container. Do not put them in a trash can. If you do not have a sharps container, call your pharmacist or healthcare provider to get one. Talk to your pediatrician regarding the use of this medicine in children. While this drug may be prescribed for children as young as 2 years for selected conditions, precautions do apply. Overdosage: If you think you have taken too much of  this medicine contact a poison control center or emergency room at once. NOTE: This medicine is only for you. Do not share this medicine with others. What if I miss a dose? It is important not to miss your dose. Call your doctor or health care professional if you are unable to keep an appointment. If you give yourself the medicine and you miss a dose, talk with your doctor or health care professional. Do not take double or extra doses. What may interact with this medicine? This medicine may interact with the following medications:  acitretin  aspirin or aspirin-like medicines including salicylates  azathioprine  certain antibiotics like chloramphenicol, penicillin, tetracycline  certain medicines for stomach problems like esomeprazole, omeprazole, pantoprazole  cyclosporine  gold  hydroxychloroquine  live virus vaccines  mercaptopurine  NSAIDs, medicines for pain and inflammation, like ibuprofen or naproxen  other cytotoxic agents  penicillamine  phenylbutazone  phenytoin  probenacid  retinoids such as isotretinoin and tretinoin  steroid medicines like prednisone or cortisone  sulfonamides like sulfasalazine and trimethoprim/sulfamethoxazole  theophylline This list may not describe all possible interactions. Give your health care provider a list of all the medicines, herbs, non-prescription drugs, or dietary supplements you use. Also tell them if you smoke, drink alcohol, or use illegal drugs. Some items may interact with your medicine. What should I watch for while using this medicine? Avoid alcoholic drinks. In some cases, you may be given additional medicines to help with side effects. Follow all directions for their use. This medicine can make you more sensitive to   the sun. Keep out of the sun. If you cannot avoid being in the sun, wear protective clothing and use sunscreen. Do not use sun lamps or tanning beds/booths. You may get drowsy or dizzy. Do not drive,  use machinery, or do anything that needs mental alertness until you know how this medicine affects you. Do not stand or sit up quickly, especially if you are an older patient. This reduces the risk of dizzy or fainting spells. You may need blood work done while you are taking this medicine. Call your doctor or health care professional for advice if you get a fever, chills or sore throat, or other symptoms of a cold or flu. Do not treat yourself. This drug decreases your body's ability to fight infections. Try to avoid being around people who are sick. This medicine may increase your risk to bruise or bleed. Call your doctor or health care professional if you notice any unusual bleeding. Check with your doctor or health care professional if you get an attack of severe diarrhea, nausea and vomiting, or if you sweat a lot. The loss of too much body fluid can make it dangerous for you to take this medicine. Talk to your doctor about your risk of cancer. You may be more at risk for certain types of cancers if you take this medicine. Both men and women must use effective birth control with this medicine. Do not become pregnant while taking this medicine or until at least 1 normal menstrual cycle has occurred after stopping it. Women should inform their doctor if they wish to become pregnant or think they might be pregnant. Men should not father a child while taking this medicine and for 3 months after stopping it. There is a potential for serious side effects to an unborn child. Talk to your health care professional or pharmacist for more information. Do not breast-feed an infant while taking this medicine. What side effects may I notice from receiving this medicine? Side effects that you should report to your doctor or health care professional as soon as possible:  allergic reactions like skin rash, itching or hives, swelling of the face, lips, or tongue  back pain  breathing problems or shortness of  breath  confusion  diarrhea  dry, nonproductive cough  low blood counts - this medicine may decrease the number of white blood cells, red blood cells and platelets. You may be at increased risk of infections and bleeding  mouth sores  redness, blistering, peeling or loosening of the skin, including inside the mouth  seizures  severe headaches  signs of infection - fever or chills, cough, sore throat, pain or difficulty passing urine  signs and symptoms of bleeding such as bloody or black, tarry stools; red or dark-brown urine; spitting up blood or brown material that looks like coffee grounds; red spots on the skin; unusual bruising or bleeding from the eye, gums, or nose  signs and symptoms of kidney injury like trouble passing urine or change in the amount of urine  signs and symptoms of liver injury like dark yellow or brown urine; general ill feeling or flu-like symptoms; light-colored stools; loss of appetite; nausea; right upper belly pain; unusually weak or tired; yellowing of the eyes or skin  stiff neck  vomiting Side effects that usually do not require medical attention (report to your doctor or health care professional if they continue or are bothersome):  dizziness  hair loss  headache  stomach pain  upset stomach This list   may not describe all possible side effects. Call your doctor for medical advice about side effects. You may report side effects to FDA at 1-800-FDA-1088. Where should I keep my medicine? If you are using this medicine at home, you will be instructed on how to store this medicine. Throw away any unused medicine after the expiration date on the label. NOTE: This sheet is a summary. It may not cover all possible information. If you have questions about this medicine, talk to your doctor, pharmacist, or health care provider.  2020 Elsevier/Gold Standard (2017-03-11 13:31:42)   Standing Labs We placed an order today for your standing lab  work.   Please have your standing labs drawn in 2 weeks x 2 and then every 2 months  If possible, please have your labs drawn 2 weeks prior to your appointment so that the provider can discuss your results at your appointment.  We have open lab daily Monday through Thursday from 8:30-12:30 PM and 1:30-4:30 PM and Friday from 8:30-12:30 PM and 1:30-4:00 PM at the office of Dr. Pollyann Savoy, [facility].   Please be advised, patients with office appointments requiring lab work will take precedents over walk-in lab work.  If possible, please come for your lab work on Monday and Friday afternoons, as you may experience shorter wait times. The office is located at [address], [place], [place] [address] No appointment is necessary.   Labs are drawn by [facility]. Please bring your co-pay at the time of your lab draw.  You may receive a bill from [facility] for your lab work.  If you wish to have your labs drawn at another location, please call the office 24 hours in advance to send orders.  If you have any questions regarding directions or hours of operation,  please call [phone].   As a reminder, please drink plenty of water prior to coming for your lab work. Thanks!  COVID-19 vaccine recommendations:   COVID-19 vaccine is recommended for everyone (unless you are allergic to a vaccine component), even if you are on a medication that suppresses your immune system.   If you are on Methotrexate, Cellcept (mycophenolate), Rinvoq, Harriette Ohara, and Olumiant- hold the medication for 1 week after each vaccine. Hold Methotrexate for 2 weeks after the single dose COVID-19 vaccine.   Do not take Tylenol or any anti-inflammatory medications (NSAIDs) 24 hours prior to the COVID-19 vaccination.   There is no direct evidence about the efficacy of the COVID-19 vaccine in individuals who are on medications that suppress the immune system.   Even if you are fully vaccinated, and you  are on any medications that suppress your immune system, please continue to wear a mask, maintain at least six feet social distance and practice hand hygiene.   If you develop a COVID-19 infection, please contact your PCP or our office to determine if you need monoclonal antibody infusion.  The booster vaccine is now available for immunocompromised patients.   Please see the following web sites for updated information.   [email or web address]

## 2020-08-19 ENCOUNTER — Ambulatory Visit: Payer: BC Managed Care – PPO | Admitting: Gastroenterology

## 2020-08-20 ENCOUNTER — Telehealth: Payer: Self-pay | Admitting: *Deleted

## 2020-08-20 DIAGNOSIS — Z79899 Other long term (current) drug therapy: Secondary | ICD-10-CM

## 2020-08-20 NOTE — Telephone Encounter (Signed)
Patient contacted the office requesting her lab orders be released to [facility].   Lab orders released and patient advised.

## 2020-08-21 ENCOUNTER — Encounter: Payer: Self-pay | Admitting: Gastroenterology

## 2020-08-21 ENCOUNTER — Ambulatory Visit (INDEPENDENT_AMBULATORY_CARE_PROVIDER_SITE_OTHER): Payer: BC Managed Care – PPO | Admitting: Gastroenterology

## 2020-08-21 VITALS — BP 134/74 | HR 76 | Ht 66.0 in | Wt 194.4 lb

## 2020-08-21 DIAGNOSIS — Z8719 Personal history of other diseases of the digestive system: Secondary | ICD-10-CM | POA: Diagnosis not present

## 2020-08-21 DIAGNOSIS — K50919 Crohn's disease, unspecified, with unspecified complications: Secondary | ICD-10-CM | POA: Diagnosis not present

## 2020-08-21 DIAGNOSIS — D259 Leiomyoma of uterus, unspecified: Secondary | ICD-10-CM

## 2020-08-21 NOTE — Patient Instructions (Signed)
Your provider has requested that you go to the basement level for lab work before leaving today. Press "B" on the elevator. The lab is located at the first door on the left as you exit the elevator.  Thank you,  Dr. Jackquline Denmark

## 2020-08-21 NOTE — Progress Notes (Signed)
Chief Complaint: Recent diverticulitis  Referring Provider: Dr. Marco Collie      ASSESSMENT AND PLAN;   #1. Crohn's disease: Dx 2006, involving R colon. #2. Extraintestinal manifestations- episcleritis and sacroiliitis. #3. H/O diverticulitis (CT 07/2020 cipro and flagyl)). #4. IBS-D (may have non-celiac gluten sensitivity, negative small bowel biopsies for celiac on EGD 11/2008)  Plan: - Continue Humira 40mg  Q2 weekly for now. - MTX added per rheumatology Jan 2022 - Check CBC, CMP, Humira trough level and Abs, CRP before the next Humira dose. - Stool for calprotectin, GI path. - Pred taper @ 5 mg/every 10 days gradually starting in 2 weeks. MTX response may take up to 12 weeks.  Watch for exacerbation. - OK to proceed with booster COVID 19 vaccine. - Bone density scan.  Tammy Carey will get in touch with Dr. Marco Collie - Appt with Dr. Darrel Hoover IBD clinic at [facility]  Tammy Carey knows her personally as well.    Future plans: -Shift to Stelara if ADA +. -Monitor response to MTX. -Ophthalmology appt  HPI:    Tammy Carey is a 65 y.o. female  For follow-up visit.  With what acute exacerbation 04/16/2020 CRP 40, started on prednisone.  Unfortunately had COVID 19 infection May 17, 2020.  Humira was held for 3 doses and then restarted. Prednisone was tapered off July 10, 2020.  She was off prednisone for 10 days and then again started having abdominal pain requiring CT Abdo/pelvis with p.o. and IV contrast which showed acute diverticulitis.  CRP was 10 at that time.  She was given a 7-day course of Cipro and Flagyl.  Prednisone was restarted at 40 mg p.o. once a day along with Humira.  She has been gradually able to taper it down to 20 mg p.o. once a day.  Seen by rheumatology and has been started on MTX + folic acid Jan 4, 123456.  She does feel better from GI standpoint.  She is back to baseline with softer BMs 2-3/day without nocturnal symptoms.  Her abdominal pain and  cramps have resolved.  She would occasionally have bowel movements after meals especially when she goes out and eats.  This previously has been attributed to IBS.   Pertinent labs: -TB Gold 07/02/2020: neg -HBsAg -HCV-07/02/2020: neg -Sed rate 06/2020 -TPMT Nl 06/2018  Colonoscopy 05/04/2018 - A few erosions in the ascending colon and in the cecum. Biopsied. - Moderate left colonic diverticulosis. - Non-bleeding internal hemorrhoids. - Nl TI - Path: 1. Surgical [P], terminal ileum - SMALL BOWEL MUCOSA WITH LYMPHOID AGGREGATES - NO ACUTE INFLAMMATION, GRANULOMAS OR MALIGNANCY IDENTIFIED 2. Surgical [P], ascending and cecum - CHRONIC ACTIVE COLITIS WITH ULCER - NO GRANULOMATA, DYSPLASIA OR MALIGNANCY IDENTIFIED 3. Surgical [P], left side - BENIGN COLONIC MUCOSA WITH MINIMAL ARCHITECTURE DISTORTION - NO ACTIVE INFLAMMATION, HIGH GRADE DYSPLASIA OR MALIGNANCY IDENTIFIED   MTX SQ 2nd dose 15mg    1/16 - Humira.   abdo Diarrhea Had fever    Back to baseline of BM 1 to 2/day.   No nausea, vomiting, heartburn, regurgitation, odynophagia or dysphagia.  No significant diarrhea or constipation.  No melena or hematochezia. No unintentional weight loss. No abdominal pain.  Has been bothered by tinnitus.  She would like to stop mesalamine.  Seen at request of Dr. Nyra Capes H/O diverticulitis on CT June 2019, treated with Cipro and Flagyl for 10 days. (she had right sided abdominal pain, CT showed left sigmoid diverticulitis without any abscesses.  C-reactive protein was significantly elevated 23 -  recent C-reactive protein was 3) No nausea, vomiting, heartburn, regurgitation, odynophagia or dysphagia.  No significant diarrhea or constipation.  There is no melena or hematochezia. No unintentional weight loss. Has been on gluten-free diet-feels better.  Past GI procedures: -Colonoscopy ([facility]) 11/05/2008: Mild sigmoid diverticulosis, otherwise normal colon to TI, neg TI and random colonic  biopsies. Past Medical History:  Diagnosis Date  . Anemia   . Asthma   . Crohn's disease ([place])   . Hx of migraine headaches   . Hypertension   . Pulsatile tinnitus     Past Surgical History:  Procedure Laterality Date  . APPENDECTOMY    . BREAST ENHANCEMENT SURGERY     Per patient - in the 80's, replaced around 2008  . COLONOSCOPY  11/05/2008   Mild colonic diverticulosis. Otherwise normal colonoscopy to TI. Minimal activity by endoscopic criteria.   Marland Kitchen ESOPHAGOGASTRODUODENOSCOPY  11/01/2008   Normal EGD.   Marland Kitchen LAPAROTOMY     for endometriosis    Family History  Problem Relation Age of Onset  . Hypertension Mother   . Hypercholesterolemia Mother   . Asthma Father   . COPD Father   . Prostate cancer Father   . Hypertension Brother   . Hypercholesterolemia Brother   . ADD / ADHD Son   . Rheum arthritis Cousin   . Rheum arthritis Cousin     Social History  Assoc Agricultural consultant Tobacco Use  . Smoking status: Never Smoker  . Smokeless tobacco: Never Used  Substance Use Topics  . Alcohol use: Yes    Alcohol/week: 3.0 standard drinks    Types: 3 Standard drinks or equivalent per week  . Drug use: Never    Current Outpatient Medications  Medication Sig Dispense Refill  . AMBULATORY NON FORMULARY MEDICATION as needed. GI cocktail    . DULoxetine (CYMBALTA) 30 MG capsule Take 30 mg by mouth daily.    Marland Kitchen estradiol (ESTRACE) 0.5 MG tablet Take 0.5 mg by mouth daily.    Marland Kitchen ESTROGENS CONJ SYNTHETIC A PO Take 1 tablet by mouth daily.    . folic acid (FOLVITE) 1 MG tablet Take 2 tablets (2 mg total) by mouth daily. 180 tablet 2  . HUMIRA PEN 40 MG/0.4ML PNKT INJECT 1 PEN UNDER THE SKIN EVERY 14 DAYS. 2 each 3  . methotrexate 50 MG/2ML injection Inject 0.9mL into the skin once weekly for 2 weeks, if labs are stable increase to 0.30mL into the skin once weekly. (Patient taking differently: Inject 0.60mL into the skin once weekly for 2 weeks, if labs are stable increase to 0.19mL  into the skin once weekly. Does on Saturday currently doing 0.48ml) 4 mL 0  . predniSONE (DELTASONE) 20 MG tablet Take 20 mg by mouth daily.    . progesterone (PROMETRIUM) 100 MG capsule 100 mg daily.     . Tuberculin-Allergy Syringes 27G X 1/2" 1 ML MISC Use 1 syringe once weekly to inject methotrexate. 12 each 3  . verapamil (VERELAN PM) 120 MG 24 hr capsule Take 120 mg by mouth daily.     No current facility-administered medications for this visit.    Allergies  Allergen Reactions  . Penicillins Other (See Comments)    Pt. Stated," I just know I am."    Review of Systems:  neg     Physical Exam:    BP 134/74   Pulse 76   Ht 5\' 6"  (1.676 m)   Wt 194 lb 6 oz (88.2 kg)   BMI  31.37 kg/m  Filed Weights   08/21/20 1028  Weight: 194 lb 6 oz (88.2 kg)   Constitutional:  Well-developed, in no acute distress. Psychiatric: Normal mood and affect. Behavior is normal. HEENT: Pupils normal.  Conjunctivae are normal. No scleral icterus. Cardiovascular: Normal rate, regular rhythm. No edema Pulmonary/chest: Effort normal and breath sounds normal. No wheezing, rales or rhonchi. Abdominal: Soft, nondistended. Nontender. Bowel sounds active throughout. There are no masses palpable. No hepatomegaly. Rectal:  defered Neurological: Alert and oriented to person place and time. Skin: Skin is warm and dry. No rashes noted.     [name], MD 08/21/2020, 10:43 AM  Cc: Dr. Marco Collie

## 2020-08-22 ENCOUNTER — Other Ambulatory Visit: Payer: Self-pay | Admitting: *Deleted

## 2020-08-22 DIAGNOSIS — Z79899 Other long term (current) drug therapy: Secondary | ICD-10-CM

## 2020-08-22 LAB — CBC WITH DIFFERENTIAL/PLATELET
Basophils Absolute: 0.1 10*3/uL (ref 0.0–0.2)
Basos: 1 %
EOS (ABSOLUTE): 0 10*3/uL (ref 0.0–0.4)
Eos: 0 %
Hematocrit: 38.5 % (ref 34.0–46.6)
Hemoglobin: 13 g/dL (ref 11.1–15.9)
Immature Grans (Abs): 0.1 10*3/uL (ref 0.0–0.1)
Immature Granulocytes: 1 %
Lymphocytes Absolute: 1.3 10*3/uL (ref 0.7–3.1)
Lymphs: 13 %
MCH: 30.4 pg (ref 26.6–33.0)
MCHC: 33.8 g/dL (ref 31.5–35.7)
MCV: 90 fL (ref 79–97)
Monocytes Absolute: 0.4 10*3/uL (ref 0.1–0.9)
Monocytes: 3 %
Neutrophils Absolute: 8.6 10*3/uL — ABNORMAL HIGH (ref 1.4–7.0)
Neutrophils: 82 %
Platelets: 577 10*3/uL — ABNORMAL HIGH (ref 150–450)
RBC: 4.27 x10E6/uL (ref 3.77–5.28)
RDW: 13.6 % (ref 11.7–15.4)
WBC: 10.4 10*3/uL (ref 3.4–10.8)

## 2020-08-22 LAB — CMP14+EGFR
ALT: 11 IU/L (ref 0–32)
AST: 8 IU/L (ref 0–40)
Albumin/Globulin Ratio: 2.1 (ref 1.2–2.2)
Albumin: 4.4 g/dL (ref 3.8–4.8)
Alkaline Phosphatase: 50 IU/L (ref 44–121)
BUN/Creatinine Ratio: 12 (ref 12–28)
BUN: 12 mg/dL (ref 8–27)
Bilirubin Total: <0.2 mg/dL (ref 0.0–1.2)
CO2: 26 mmol/L (ref 20–29)
Calcium: 9.4 mg/dL (ref 8.7–10.3)
Chloride: 100 mmol/L (ref 96–106)
Creatinine, Ser: 1.01 mg/dL — ABNORMAL HIGH (ref 0.57–1.00)
GFR calc Af Amer: 68 mL/min/{1.73_m2} (ref >59)
GFR calc non Af Amer: 59 mL/min/{1.73_m2} — ABNORMAL LOW (ref >59)
Globulin, Total: 2.1 g/dL (ref 1.5–4.5)
Glucose: 133 mg/dL — ABNORMAL HIGH (ref 65–99)
Potassium: 5 mmol/L (ref 3.5–5.2)
Sodium: 139 mmol/L (ref 134–144)
Total Protein: 6.5 g/dL (ref 6.0–8.5)

## 2020-08-22 MED ORDER — METHOTREXATE SODIUM CHEMO INJECTION 50 MG/2ML: INTRAMUSCULAR | 0 refills | Status: DC

## 2020-08-22 NOTE — Telephone Encounter (Signed)
-----   Message from Bo Merino, MD sent at 08/22/2020  2:16 PM EST ----- Creatinine is elevated most likely due to methotrexate.  Please advise patient to decrease methotrexate to 0.4 mL subcu weekly.  She should repeat BMP with GFR in 2 weeks.  CBC shows elevated platelets.  I would recommend referral to hematology for t hrombocytosis.

## 2020-08-22 NOTE — Telephone Encounter (Signed)
Creatinine is elevated most likely due to methotrexate.  Please advise patient to decrease methotrexate to 0.4 mL subcu weekly.  She should repeat BMP with GFR in 2 weeks.  CBC shows elevated platelets.  I would recommend referral to hematology for thrombocytosis.

## 2020-08-30 ENCOUNTER — Other Ambulatory Visit (INDEPENDENT_AMBULATORY_CARE_PROVIDER_SITE_OTHER): Payer: BC Managed Care – PPO

## 2020-08-30 ENCOUNTER — Other Ambulatory Visit: Payer: Self-pay

## 2020-08-30 DIAGNOSIS — D259 Leiomyoma of uterus, unspecified: Secondary | ICD-10-CM | POA: Diagnosis not present

## 2020-08-30 DIAGNOSIS — Z8719 Personal history of other diseases of the digestive system: Secondary | ICD-10-CM | POA: Diagnosis not present

## 2020-08-30 DIAGNOSIS — K50919 Crohn's disease, unspecified, with unspecified complications: Secondary | ICD-10-CM

## 2020-08-30 LAB — COMPREHENSIVE METABOLIC PANEL
ALT: 9 U/L (ref 0–35)
AST: 10 U/L (ref 0–37)
Albumin: 3.9 g/dL (ref 3.5–5.2)
Alkaline Phosphatase: 38 U/L — ABNORMAL LOW (ref 39–117)
BUN: 12 mg/dL (ref 6–23)
CO2: 34 mEq/L — ABNORMAL HIGH (ref 19–32)
Calcium: 9.2 mg/dL (ref 8.4–10.5)
Chloride: 101 mEq/L (ref 96–112)
Creatinine, Ser: 0.79 mg/dL (ref 0.40–1.20)
GFR: 79.49 mL/min (ref >60.00)
Glucose, Bld: 93 mg/dL (ref 70–99)
Potassium: 3.8 mEq/L (ref 3.5–5.1)
Sodium: 140 mEq/L (ref 135–145)
Total Bilirubin: 0.4 mg/dL (ref 0.2–1.2)
Total Protein: 6 g/dL (ref 6.0–8.3)

## 2020-08-30 LAB — CBC
HCT: 37.5 % (ref 36.0–46.0)
Hemoglobin: 12.3 g/dL (ref 12.0–15.0)
MCHC: 32.8 g/dL (ref 30.0–36.0)
MCV: 92 fl (ref 78.0–100.0)
Platelets: 426 10*3/uL — ABNORMAL HIGH (ref 150.0–400.0)
RBC: 4.08 Mil/uL (ref 3.87–5.11)
RDW: 14.5 % (ref 11.5–15.5)
WBC: 12.7 10*3/uL — ABNORMAL HIGH (ref 4.0–10.5)

## 2020-08-30 LAB — C-REACTIVE PROTEIN: CRP: <1 mg/dL (ref 0.5–20.0)

## 2020-09-02 ENCOUNTER — Other Ambulatory Visit: Payer: Self-pay | Admitting: *Deleted

## 2020-09-02 ENCOUNTER — Telehealth: Payer: Self-pay

## 2020-09-02 DIAGNOSIS — Z79899 Other long term (current) drug therapy: Secondary | ICD-10-CM

## 2020-09-02 NOTE — Telephone Encounter (Signed)
[facility] lab order released, BMP w/GRR

## 2020-09-02 NOTE — Telephone Encounter (Signed)
Patient called requesting her labwork orders be sent to [facility] in [place].

## 2020-09-03 NOTE — Progress Notes (Signed)
Office Visit Note  Patient: Tammy Carey             Date of Birth: 05-Jan-1956           MRN: [id number]             PCP: Marco Collie, MD Referring: Marco Collie, MD Visit Date: 09/17/2020 Occupation: @GUAROCC @  Subjective:  Medication management.   History of Present Illness: Tammy Carey is a 65 y.o. female with history of Crohn's disease and inflammatory arthritis.  She states she is doing much better on the combination of methotrexate and Humira.  She states she had a flare of Crohn's disease  in September 2021.  She had COVID-19 infection in October 2021 and then another flare of Crohn's disease in December 2021.  She was started on prednisone 20 mg p.o. daily by Dr. Lyndel Safe.  She has been tapering prednisone gradually and currently is taking prednisone 5 mg p.o. daily.  She started methotrexate 0.4 mL subcu weekly about 6 weeks ago.  When we increase the dose of methotrexate to 0.6 mL subcu weekly her creatinine went up.  She states it was because she was not well hydrated.  She would like to increase the methotrexate dose again.  She has not had any recent iritis flare but she has some mild irritation.  She will be schedule an appointment with Dr. Manuella Ghazi.  Activities of Daily Living:  Patient reports morning stiffness for 0 minutes.   Patient Denies nocturnal pain.  Difficulty dressing/grooming: Denies Difficulty climbing stairs: Reports Difficulty getting out of chair: Denies Difficulty using hands for taps, buttons, cutlery, and/or writing: Denies  Review of Systems  Constitutional: Positive for fatigue.  HENT: Negative for mouth sores, mouth dryness and nose dryness.   Eyes: Negative for pain, itching, visual disturbance and dryness.  Respiratory: Negative for cough, hemoptysis, shortness of breath and difficulty breathing.   Cardiovascular: Negative for chest pain, palpitations and swelling in legs/feet.  Gastrointestinal: Negative for abdominal pain, blood in stool,  constipation and diarrhea.  Endocrine: Negative for increased urination.  Genitourinary: Negative for painful urination.  Musculoskeletal: Negative for arthralgias, joint pain, joint swelling, myalgias, muscle weakness, morning stiffness, muscle tenderness and myalgias.  Skin: Positive for rash. Negative for color change and redness.  Allergic/Immunologic: Negative for susceptible to infections.  Neurological: Negative for dizziness, numbness, headaches, memory loss and weakness.  Hematological: Negative for swollen glands.  Psychiatric/Behavioral: Negative for confusion and sleep disturbance.    PMFS History:  Patient Active Problem List   Diagnosis Date Noted  . Hx of migraines 07/02/2020  . History of asthma 07/02/2020  . History of IBS 07/02/2020  . History of diverticulitis 07/02/2020  . Crohn's disease with complication ([place]) 123456  . Essential hypertension 07/02/2020  . Episcleritis of left eye 07/02/2020    Past Medical History:  Diagnosis Date  . Anemia   . Asthma   . Crohn's disease ([place])   . Hx of migraine headaches   . Hypertension   . Pulsatile tinnitus     Family History  Problem Relation Age of Onset  . Hypertension Mother   . Hypercholesterolemia Mother   . Asthma Father   . COPD Father   . Prostate cancer Father   . Hypertension Brother   . Hypercholesterolemia Brother   . ADD / ADHD Son   . Rheum arthritis Cousin   . Rheum arthritis Cousin    Past Surgical History:  Procedure Laterality Date  . APPENDECTOMY    .  BREAST ENHANCEMENT SURGERY     Per patient - in the 80's, replaced around 2008  . COLONOSCOPY  11/05/2008   Mild colonic diverticulosis. Otherwise normal colonoscopy to TI. Minimal activity by endoscopic criteria.   Marland Kitchen ESOPHAGOGASTRODUODENOSCOPY  11/01/2008   Normal EGD.   Marland Kitchen LAPAROTOMY     for endometriosis   Social History   Social History Narrative  . Not on file   Immunization History  Administered Date(s) Administered  .  Moderna Sars-Covid-2 Vaccination 08/29/2020  . PFIZER(Purple Top)SARS-COV-2 Vaccination 08/23/2019, 09/13/2019     Objective: Vital Signs: BP 120/75 (BP Location: Left Arm, Patient Position: Sitting, Cuff Size: Normal)   Pulse 81   Ht 5' 6.5" (1.689 m)   Wt 198 lb 3.2 oz (89.9 kg)   BMI 31.51 kg/m    Physical Exam Vitals and nursing note reviewed.  Constitutional:      Appearance: She is well-developed and well-nourished.  HENT:     Head: Normocephalic and atraumatic.  Eyes:     Extraocular Movements: EOM normal.     Conjunctiva/sclera: Conjunctivae normal.  Cardiovascular:     Rate and Rhythm: Normal rate and regular rhythm.     Pulses: Intact distal pulses.     Heart sounds: Normal heart sounds.  Pulmonary:     Effort: Pulmonary effort is normal.     Breath sounds: Normal breath sounds.  Abdominal:     General: Bowel sounds are normal.     Palpations: Abdomen is soft.  Musculoskeletal:     Cervical back: Normal range of motion.  Lymphadenopathy:     Cervical: No cervical adenopathy.  Skin:    General: Skin is warm and dry.     Capillary Refill: Capillary refill takes less than 2 seconds.  Neurological:     Mental Status: She is alert and oriented to person, place, and time.  Psychiatric:        Mood and Affect: Mood and affect normal.        Behavior: Behavior normal.      Musculoskeletal Exam: C-spine was in good range of motion.  Shoulder joints, elbow joints, wrist joints, MCPs PIPs and DIPs with good range of motion with no synovitis.  Hip joints, knee joints, ankles, MTPs and PIPs with good range of motion with no synovitis.  There was no evidence of plantar fasciitis or Achilles tendinitis.  CDAI Exam: CDAI Score: - Patient Global: -; Provider Global: - Swollen: -; Tender: - Joint Exam 09/17/2020   No joint exam has been documented for this visit   There is currently no information documented on the homunculus. Go to the Rheumatology activity and  complete the homunculus joint exam.  Investigation: No additional findings.  Imaging: No results found.  Recent Labs: Lab Results  Component Value Date   WBC 12.7 (H) 08/30/2020   HGB 12.3 08/30/2020   PLT 426.0 (H) 08/30/2020   NA 140 09/04/2020   K 4.9 09/04/2020   CL 101 09/04/2020   CO2 25 09/04/2020   GLUCOSE 155 (H) 09/04/2020   BUN 12 09/04/2020   CREATININE 0.62 09/04/2020   BILITOT 0.4 08/30/2020   ALKPHOS 38 (L) 08/30/2020   AST 10 08/30/2020   ALT 9 08/30/2020   PROT 6.0 08/30/2020   ALBUMIN 3.9 08/30/2020   CALCIUM 9.2 09/04/2020   GFRAA 111 09/04/2020   QFTBGOLDPLUS NEGATIVE 07/02/2020    Speciality Comments: No specialty comments available.  Procedures:  No procedures performed Allergies: Penicillins   Assessment / Plan:  Visit Diagnoses: Crohn's disease with complication, unspecified gastrointestinal tract location [facility]) - Treated with Lialda in the past and now on Humira by Dr. Lyndel Safe.  Methotrexate was added about 6 weeks ago.  She is currently on Humira 40 mg subcu every other day and methotrexate 0.4 mL subcu once a week.  We will try increasing methotrexate again to 0.6 mL subcutaneous weekly.  She believes the elevation in creatinine was due to not being hydrated well.  We will check labs in 2 weeks after increasing the dose of methotrexate then every 3 months.  Prescription refill for methotrexate was sent today.  High risk medication use - Humira 40 mg subcu every 14 days, methotrexate 0.4 mL subcu injections once weekly, folic acid 1mg  po qd.    Sacroiliitis, not elsewhere classified ([place]) - Chronic SI joint pain.  Bilateral SI joint sclerosis.  She has off-and-on discomfort in the SI joints.  Although since she has been on prednisone she does not have any discomfort in the SI joints.  Chronic SI joint pain  Primary osteoarthritis of both knees-she has chronic discomfort in her knee joints due to underlying osteoarthritis.  Episcleritis of  left eye - Episcleritis every 8 weeks since 2020.  She was referred to ophthalmology.  She will call and schedule an appointment with Dr. Manuella Ghazi.  Thrombocytosis-we will continue to monitor.  Thyroid nodule - Incidentally noted on the imaging study.  Patient states she will be getting ultrasound to Dr. Red Christians office.  Hepatic cyst - Multiple probable hepatic cysts were noted on CT chest.  She will discuss this further with Dr. Lyndel Safe.  Other fatigue - CK is normal.  History of diverticulitis-followed by Dr. Antonietta Barcelona blood pressure was normal today.  History of asthma  Essential hypertension  History of IBS  Hx of migraines  Stress  COVID-19 virus infection - October 2021.  She received monoclonal antibody infusion.  She is fully vaccinated against COVID-19 and had a third dose.  A fourth dose 5 to 6 months after the third dose was discussed.  Use of mask, social distancing and hand hygiene was discussed.  Orders: No orders of the defined types were placed in this encounter.  Meds ordered this encounter  Medications  . methotrexate 50 MG/2ML injection    Sig: Inject 0.57mL into the skin once weekly.    Dispense:  8 mL    Refill:  0     Follow-Up Instructions: Return in about 3 months (around 12/15/2020) for Crohn's disease.   Bo Merino, MD  Note - This record has been created using Editor, commissioning.  Chart creation errors have been sought, but may not always  have been located. Such creation errors do not reflect on  the standard of medical care.

## 2020-09-05 ENCOUNTER — Telehealth: Payer: Self-pay | Admitting: Gastroenterology

## 2020-09-05 LAB — BMP8+EGFR
BUN/Creatinine Ratio: 19 (ref 12–28)
BUN: 12 mg/dL (ref 8–27)
CO2: 25 mmol/L (ref 20–29)
Calcium: 9.2 mg/dL (ref 8.7–10.3)
Chloride: 101 mmol/L (ref 96–106)
Creatinine, Ser: 0.62 mg/dL (ref 0.57–1.00)
GFR calc Af Amer: 111 mL/min/{1.73_m2} (ref >59)
GFR calc non Af Amer: 96 mL/min/{1.73_m2} (ref >59)
Glucose: 155 mg/dL — ABNORMAL HIGH (ref 65–99)
Potassium: 4.9 mmol/L (ref 3.5–5.2)
Sodium: 140 mmol/L (ref 134–144)

## 2020-09-05 NOTE — Telephone Encounter (Signed)
I have called patient and left a message for a return phone call.

## 2020-09-05 NOTE — Telephone Encounter (Signed)
I have called patient back and went over labs with her. Patient voiced understanding

## 2020-09-05 NOTE — Telephone Encounter (Signed)
Patient is returning your call.  

## 2020-09-05 NOTE — Telephone Encounter (Signed)
Inbound call from patient returning your call in regards to her lab results. 

## 2020-09-05 NOTE — Telephone Encounter (Signed)
Patient called, based on lab results from 09/04/2020, how do you want patient to take her MTX? Please advise.   Labs 09/04/2020 - Glucose is 155. Rest of BMP WNL.

## 2020-09-05 NOTE — Telephone Encounter (Signed)
Seth Bake called patient and advised to stay on MTX 0.4 ml weekly

## 2020-09-05 NOTE — Telephone Encounter (Signed)
Patient should stay on methotrexate 0.4 mL subcu weekly.

## 2020-09-07 LAB — GI PROFILE, STOOL, PCR

## 2020-09-07 LAB — ADALIMUMAB+AB (SERIAL MONITOR)
Adalimumab Drug Level: 8.6 ug/mL
Anti-Adalimumab Antibody: <25 ng/mL

## 2020-09-08 NOTE — Progress Notes (Signed)
I have informed patient. Humira drug level 8.6, undetectable antibodies Fecal calprotectin is 32 (normal)(was ordered through [employer] [facility]) Send report to family physician

## 2020-09-10 ENCOUNTER — Other Ambulatory Visit: Payer: Self-pay | Admitting: Gastroenterology

## 2020-09-10 DIAGNOSIS — K50919 Crohn's disease, unspecified, with unspecified complications: Secondary | ICD-10-CM

## 2020-09-17 ENCOUNTER — Ambulatory Visit: Payer: BC Managed Care – PPO | Admitting: Rheumatology

## 2020-09-17 ENCOUNTER — Other Ambulatory Visit: Payer: Self-pay

## 2020-09-17 ENCOUNTER — Encounter: Payer: Self-pay | Admitting: Rheumatology

## 2020-09-17 VITALS — BP 120/75 | HR 81 | Ht 66.5 in | Wt 198.2 lb

## 2020-09-17 DIAGNOSIS — F439 Reaction to severe stress, unspecified: Secondary | ICD-10-CM

## 2020-09-17 DIAGNOSIS — M533 Sacrococcygeal disorders, not elsewhere classified: Secondary | ICD-10-CM

## 2020-09-17 DIAGNOSIS — K7689 Other specified diseases of liver: Secondary | ICD-10-CM

## 2020-09-17 DIAGNOSIS — M461 Sacroiliitis, not elsewhere classified: Secondary | ICD-10-CM

## 2020-09-17 DIAGNOSIS — G8929 Other chronic pain: Secondary | ICD-10-CM

## 2020-09-17 DIAGNOSIS — U071 COVID-19: Secondary | ICD-10-CM

## 2020-09-17 DIAGNOSIS — E041 Nontoxic single thyroid nodule: Secondary | ICD-10-CM

## 2020-09-17 DIAGNOSIS — K50919 Crohn's disease, unspecified, with unspecified complications: Secondary | ICD-10-CM

## 2020-09-17 DIAGNOSIS — R5383 Other fatigue: Secondary | ICD-10-CM

## 2020-09-17 DIAGNOSIS — H15102 Unspecified episcleritis, left eye: Secondary | ICD-10-CM

## 2020-09-17 DIAGNOSIS — Z79899 Other long term (current) drug therapy: Secondary | ICD-10-CM

## 2020-09-17 DIAGNOSIS — Z8616 Personal history of COVID-19: Secondary | ICD-10-CM

## 2020-09-17 DIAGNOSIS — Z8669 Personal history of other diseases of the nervous system and sense organs: Secondary | ICD-10-CM

## 2020-09-17 DIAGNOSIS — Z8719 Personal history of other diseases of the digestive system: Secondary | ICD-10-CM

## 2020-09-17 DIAGNOSIS — D75839 Thrombocytosis, unspecified: Secondary | ICD-10-CM

## 2020-09-17 DIAGNOSIS — I10 Essential (primary) hypertension: Secondary | ICD-10-CM

## 2020-09-17 DIAGNOSIS — Z8709 Personal history of other diseases of the respiratory system: Secondary | ICD-10-CM

## 2020-09-17 DIAGNOSIS — M170 Bilateral primary osteoarthritis of knee: Secondary | ICD-10-CM

## 2020-09-17 MED ORDER — METHOTREXATE SODIUM CHEMO INJECTION 50 MG/2ML: INTRAMUSCULAR | 0 refills | Status: DC

## 2020-09-17 NOTE — Patient Instructions (Signed)
Standing Labs We placed an order today for your standing lab work.   Please have your standing labs drawn in in 2 weeks after increasing the dose of methotrexate and then every 3 months.  If possible, please have your labs drawn 2 weeks prior to your appointment so that the provider can discuss your results at your appointment.  We have open lab daily Monday through Thursday from 1:30-4:30 PM and Friday from 1:30-4:00 PM at the office of Dr. Bo Merino, [facility].   Please be advised, all patients with office appointments requiring lab work will take precedents over walk-in lab work.  If possible, please come for your lab work on Monday and Friday afternoons, as you may experience shorter wait times. The office is located at [address] [place], [place], [place] [address] No appointment is necessary.   Labs are drawn by [facility]. Please bring your co-pay at the time of your lab draw.  You may receive a bill from [facility] for your lab work.  If you wish to have your labs drawn at another location, please call the office 24 hours in advance to send orders.  If you have any questions regarding directions or hours of operation,  please call [phone].   As a reminder, please drink plenty of water prior to coming for your lab work. Thanks!

## 2020-09-30 ENCOUNTER — Telehealth: Payer: Self-pay

## 2020-09-30 ENCOUNTER — Other Ambulatory Visit: Payer: Self-pay | Admitting: *Deleted

## 2020-09-30 DIAGNOSIS — Z79899 Other long term (current) drug therapy: Secondary | ICD-10-CM

## 2020-09-30 NOTE — Telephone Encounter (Signed)
Patient called requesting her labwork orders be sent to [facility] in [place].  Patient states she will be going on Wednesday, 10/02/20.

## 2020-09-30 NOTE — Telephone Encounter (Signed)
Labs released, I called patient.

## 2020-10-03 LAB — CMP14+EGFR
ALT: 26 IU/L (ref 0–32)
AST: 20 IU/L (ref 0–40)
Albumin/Globulin Ratio: 1.8 (ref 1.2–2.2)
Albumin: 4.4 g/dL (ref 3.8–4.8)
Alkaline Phosphatase: 57 IU/L (ref 44–121)
BUN/Creatinine Ratio: 16 (ref 12–28)
BUN: 12 mg/dL (ref 8–27)
Bilirubin Total: 0.3 mg/dL (ref 0.0–1.2)
CO2: 24 mmol/L (ref 20–29)
Calcium: 9 mg/dL (ref 8.7–10.3)
Chloride: 104 mmol/L (ref 96–106)
Creatinine, Ser: 0.75 mg/dL (ref 0.57–1.00)
Globulin, Total: 2.5 g/dL (ref 1.5–4.5)
Glucose: 103 mg/dL — ABNORMAL HIGH (ref 65–99)
Potassium: 4.6 mmol/L (ref 3.5–5.2)
Sodium: 144 mmol/L (ref 134–144)
Total Protein: 6.9 g/dL (ref 6.0–8.5)
eGFR: 89 mL/min/{1.73_m2} (ref >59)

## 2020-10-03 LAB — CBC WITH DIFFERENTIAL/PLATELET
Basophils Absolute: 0.1 10*3/uL (ref 0.0–0.2)
Basos: 1 %
EOS (ABSOLUTE): 0.1 10*3/uL (ref 0.0–0.4)
Eos: 2 %
Hematocrit: 40.4 % (ref 34.0–46.6)
Hemoglobin: 13.2 g/dL (ref 11.1–15.9)
Immature Grans (Abs): 0 10*3/uL (ref 0.0–0.1)
Immature Granulocytes: 0 %
Lymphocytes Absolute: 2.7 10*3/uL (ref 0.7–3.1)
Lymphs: 34 %
MCH: 30.1 pg (ref 26.6–33.0)
MCHC: 32.7 g/dL (ref 31.5–35.7)
MCV: 92 fL (ref 79–97)
Monocytes Absolute: 0.5 10*3/uL (ref 0.1–0.9)
Monocytes: 7 %
Neutrophils Absolute: 4.5 10*3/uL (ref 1.4–7.0)
Neutrophils: 56 %
Platelets: 467 10*3/uL — ABNORMAL HIGH (ref 150–450)
RBC: 4.38 x10E6/uL (ref 3.77–5.28)
RDW: 14.5 % (ref 11.7–15.4)
WBC: 8 10*3/uL (ref 3.4–10.8)

## 2020-10-03 NOTE — Progress Notes (Signed)
Glucose is mildly elevated, probably not a fasting sample.  Platelet count is high and stable.

## 2020-10-03 NOTE — Progress Notes (Signed)
She showed stay on methotrexate 0.6 mL subcu weekly.

## 2020-10-14 ENCOUNTER — Telehealth: Payer: Self-pay

## 2020-10-14 DIAGNOSIS — H15102 Unspecified episcleritis, left eye: Secondary | ICD-10-CM

## 2020-10-14 DIAGNOSIS — K50919 Crohn's disease, unspecified, with unspecified complications: Secondary | ICD-10-CM

## 2020-10-14 NOTE — Telephone Encounter (Signed)
There is referral from 2021 to Dr. Manuella Ghazi, not sure if patient went for appt in January, 2022, can you please check? New referral may need to be submitted? Thank you.  From office note in February, 2022, Episcleritis of left eye - Episcleritis every 8 weeks since 2020.  She was referred to ophthalmology.  She will call and schedule an appointment with Dr. Manuella Ghazi.

## 2020-10-14 NOTE — Telephone Encounter (Signed)
Spoke with Dr. Trena Platt office- patient was scheduled in January 2022 and no showed the appointment. New referral will be placed and sent to their office.

## 2020-10-14 NOTE — Telephone Encounter (Signed)
Patient called stating Dr. Estanislado Pandy referred her to Dr. Ardean Larsen, Optomologist.  Patient states she called their office to schedule an appointment due to having trouble with her left eye and was told they haven't received the referral.  Patient is requesting a return call when the referral is re-faxed.

## 2020-10-21 ENCOUNTER — Telehealth: Payer: Self-pay

## 2020-10-21 NOTE — Telephone Encounter (Signed)
Patient called stating she has significant joint pain especially in her pelvis and hip area.  Patient states she is having difficulty walking.  Patient requested a return call to let her know if this could be a symptom of increasing her Methotrexate medication.  Patient states she took her injection on Saturday night 10/19/20.  Patient states she called her PCP and they took labwork to see if it could be related to her Crohns.  Patient requested a return call.  Patient states you can call her husband [name] at [phone] if she can't be reached.

## 2020-10-21 NOTE — Telephone Encounter (Signed)
Please advise 

## 2020-10-22 NOTE — Telephone Encounter (Signed)
The pelvis and hip area pain should not be related to methotrexate.  X-rays of the hip joints and pelvis were unremarkable at the last office visit.  I would recommend evaluation by the gastroenterologist to look for Crohn's disease activity.

## 2020-10-22 NOTE — Telephone Encounter (Signed)
I called patient, patient saw gastroenterologist 10/21/2020

## 2020-10-23 ENCOUNTER — Other Ambulatory Visit: Payer: Self-pay | Admitting: Rheumatology

## 2020-10-28 ENCOUNTER — Telehealth: Payer: Self-pay | Admitting: Rheumatology

## 2020-10-28 NOTE — Telephone Encounter (Signed)
Patient is due for labs here this week, but had a full workup done last week at PCP's office. Patient will have lab results sent here. Please advise patient if she needs any other labs drawn for [place] once results are received.

## 2020-12-03 DIAGNOSIS — H2513 Age-related nuclear cataract, bilateral: Secondary | ICD-10-CM | POA: Insufficient documentation

## 2020-12-03 DIAGNOSIS — K5080 Crohn's disease of both small and large intestine without complications: Secondary | ICD-10-CM | POA: Insufficient documentation

## 2020-12-04 NOTE — Progress Notes (Signed)
Office Visit Note  Patient: Tammy Carey             Date of Birth: 1956/04/17           MRN: [id number]             PCP: Marco Collie, MD Referring: Marco Collie, MD Visit Date: 12/18/2020 Occupation: @GUAROCC @  Subjective:  Right SI joint pain.   History of Present Illness: Michiah Masse is a 65 y.o. female with a history of Crohn's disease, episcleritis and sacroiliitis.  She states she has not had any flares of episcleritis since she started methotrexate.  Her Crohn's disease is quiet.  She states she continues to have pain and discomfort in her right SI joint.  She would like to have a right SI joint injection.  She has some discomfort in her hands especially her left middle finger.  She does a lot of activities with the left hand.  None of the other joints are painful.  She continues to have some stiffness.  Activities of Daily Living:  Patient reports morning stiffness for 1 hour.   Patient Denies nocturnal pain.  Difficulty dressing/grooming: Denies Difficulty climbing stairs: Reports Difficulty getting out of chair: Reports Difficulty using hands for taps, buttons, cutlery, and/or writing: Denies  Review of Systems  Constitutional: Positive for fatigue.  HENT: Negative for mouth sores, mouth dryness and nose dryness.   Eyes: Negative for pain, itching, visual disturbance and dryness.  Respiratory: Negative for cough, hemoptysis, shortness of breath and difficulty breathing.   Cardiovascular: Negative for chest pain, palpitations and swelling in legs/feet.  Gastrointestinal: Negative for abdominal pain, blood in stool, constipation and diarrhea.  Endocrine: Negative for increased urination.  Genitourinary: Negative for painful urination.  Musculoskeletal: Positive for arthralgias, joint pain, myalgias, morning stiffness and myalgias. Negative for joint swelling, muscle weakness and muscle tenderness.  Skin: Negative for color change, rash and redness.   Allergic/Immunologic: Negative for susceptible to infections.  Neurological: Negative for dizziness, numbness, headaches, memory loss and weakness.  Hematological: Positive for swollen glands.  Psychiatric/Behavioral: Negative for confusion and sleep disturbance.    PMFS History:  Patient Active Problem List   Diagnosis Date Noted  . Hx of migraines 07/02/2020  . History of asthma 07/02/2020  . History of IBS 07/02/2020  . History of diverticulitis 07/02/2020  . Crohn's disease with complication ([place]) 58/04/9832  . Essential hypertension 07/02/2020  . Episcleritis of left eye 07/02/2020    Past Medical History:  Diagnosis Date  . Anemia   . Asthma   . Crohn's disease ([place])   . Hx of migraine headaches   . Hypertension   . Pulsatile tinnitus     Family History  Problem Relation Age of Onset  . Hypertension Mother   . Hypercholesterolemia Mother   . Asthma Father   . COPD Father   . Prostate cancer Father   . Hypertension Brother   . Hypercholesterolemia Brother   . ADD / ADHD Son   . Rheum arthritis Cousin   . Rheum arthritis Cousin    Past Surgical History:  Procedure Laterality Date  . APPENDECTOMY    . BREAST ENHANCEMENT SURGERY     Per patient - in the 80's, replaced around 2008  . COLONOSCOPY  11/05/2008   Mild colonic diverticulosis. Otherwise normal colonoscopy to TI. Minimal activity by endoscopic criteria.   Marland Kitchen ESOPHAGOGASTRODUODENOSCOPY  11/01/2008   Normal EGD.   Marland Kitchen LAPAROTOMY     for endometriosis   Social  History   Social History Narrative  . Not on file   Immunization History  Administered Date(s) Administered  . Moderna Sars-Covid-2 Vaccination 08/29/2020  . PFIZER(Purple Top)SARS-COV-2 Vaccination 08/23/2019, 09/13/2019     Objective: Vital Signs: BP 133/87 (BP Location: Left Arm, Patient Position: Sitting, Cuff Size: Normal)   Pulse 84   Ht 5' 6.5" (1.689 m)   Wt 196 lb 6.4 oz (89.1 kg)   BMI 31.22 kg/m    Physical Exam Vitals and  nursing note reviewed.  Constitutional:      Appearance: She is well-developed.  HENT:     Head: Normocephalic and atraumatic.  Eyes:     Conjunctiva/sclera: Conjunctivae normal.  Cardiovascular:     Rate and Rhythm: Normal rate and regular rhythm.     Heart sounds: Normal heart sounds.  Pulmonary:     Effort: Pulmonary effort is normal.     Breath sounds: Normal breath sounds.  Abdominal:     General: Bowel sounds are normal.     Palpations: Abdomen is soft.  Musculoskeletal:     Cervical back: Normal range of motion.  Lymphadenopathy:     Cervical: No cervical adenopathy.  Skin:    General: Skin is warm and dry.     Capillary Refill: Capillary refill takes less than 2 seconds.  Neurological:     Mental Status: She is alert and oriented to person, place, and time.  Psychiatric:        Behavior: Behavior normal.      Musculoskeletal Exam: C-spine was in good range of motion.  There was no tenderness over thoracic or lumbar spine.  She had tenderness on palpation of her right SI joint.  Shoulder joints, elbow joints, wrist joints, MCPs PIPs and DIPs with good range of motion with no synovitis.  Hip joints, knee joints, ankles, MTPs and PIPs with good range of motion with no synovitis.  CDAI Exam: CDAI Score: -- Patient Global: --; Provider Global: -- Swollen: --; Tender: -- Joint Exam 12/18/2020   No joint exam has been documented for this visit   There is currently no information documented on the homunculus. Go to the Rheumatology activity and complete the homunculus joint exam.  Investigation: No additional findings.  Imaging: No results found.  Recent Labs: Lab Results  Component Value Date   WBC 7.3 12/16/2020   HGB 12.0 12/16/2020   PLT 462 (H) 12/16/2020   NA 138 12/16/2020   K 4.9 12/16/2020   CL 101 12/16/2020   CO2 24 12/16/2020   GLUCOSE 104 (H) 12/16/2020   BUN 11 12/16/2020   CREATININE 0.68 12/16/2020   BILITOT <0.2 12/16/2020   ALKPHOS 53  12/16/2020   AST 19 12/16/2020   ALT 20 12/16/2020   PROT 6.2 12/16/2020   ALBUMIN 4.0 12/16/2020   CALCIUM 9.1 12/16/2020   GFRAA 111 09/04/2020   QFTBGOLDPLUS NEGATIVE 07/02/2020    Speciality Comments: No specialty comments available.  Procedures:  Sacroiliac Joint Inj on 12/18/2020 11:14 AM Indications: pain Details: 27 G 1.5 in needle, posterior approach Medications: 40 mg triamcinolone acetonide 40 MG/ML; 1.5 mL lidocaine 1 % Aspirate: 0 mL Outcome: tolerated well, no immediate complications Procedure, treatment alternatives, risks and benefits explained, specific risks discussed. Consent was given by the patient. Immediately prior to procedure a time out was called to verify the correct patient, procedure, equipment, support staff and site/side marked as required. Patient was prepped and draped in the usual sterile fashion.     Allergies: Penicillins  Assessment / Plan:     Visit Diagnoses: Crohn's disease with complication, unspecified gastrointestinal tract location [facility]) - Treated with Lialda in the past and now on Humira by Dr. Lyndel Safe.  Methotrexate was added at the last visit.  She has been on methotrexate 0.8 mL subcu weekly for the last 2 weeks.  She states her Crohn's disease has been quiet.  High risk medication use - Humira 40 mg subcu every 14 days, methotrexate 0.8 mL subcu injections once weekly, folic acid 2 mg po qd. labs obtained on Dec 16, 2020 were within normal limits.  She has mild thrombocytosis.  We will continue to monitor labs every 3 months.  Sacroiliitis, not elsewhere classified ([place]) - Chronic SI joint pain.  Bilateral SI joint sclerosis.   Chronic SI joint pain-she continues to have right SI joint pain.  She requested a cortisone injection.  After indications side effects contraindications were discussed right SI joint was injected as described above.  She tolerated the procedure well.  Postprocedure instructions were given.  Primary osteoarthritis  of both knees-she has some chronically stiffness.  Episcleritis of left eye - Episcleritis every 8 weeks since 2020.  She is followed by Dr. Manuella Ghazi.  She has not had any recent episcleritis.  She states that Dr. Manuella Ghazi recommended increasing methotrexate dose to 1 mL.  She would like to hold off increasing the dose of methotrexate until she comes back from vacation.  Thrombocytosis-stable  Thyroid nodule - Incidentally noted on the imaging study.  Hepatic cyst - Multiple probable hepatic cysts were noted on CT chest.   Other fatigue-she continues to have some fatigue.  She states her stress level is better now.  History of diverticulitis - followed by Dr. Lyndel Safe  Essential hypertension-blood pressure is normal now.  History of IBS  History of asthma  Hx of migraines  COVID-19 virus infection  Orders: Orders Placed This Encounter  Procedures  . Sacroiliac Joint Inj   No orders of the defined types were placed in this encounter.    Follow-Up Instructions: Return in about 3 months (around 03/20/2021) for Crohn, inflammatory arthritis.   Bo Merino, MD  Note - This record has been created using Editor, commissioning.  Chart creation errors have been sought, but may not always  have been located. Such creation errors do not reflect on  the standard of medical care.

## 2020-12-05 ENCOUNTER — Other Ambulatory Visit: Payer: Self-pay | Admitting: *Deleted

## 2020-12-05 ENCOUNTER — Telehealth: Payer: Self-pay | Admitting: Rheumatology

## 2020-12-05 DIAGNOSIS — Z79899 Other long term (current) drug therapy: Secondary | ICD-10-CM

## 2020-12-05 DIAGNOSIS — K50919 Crohn's disease, unspecified, with unspecified complications: Secondary | ICD-10-CM

## 2020-12-05 DIAGNOSIS — H15102 Unspecified episcleritis, left eye: Secondary | ICD-10-CM

## 2020-12-05 NOTE — Telephone Encounter (Signed)
LMOM for patient to increase dose per Dr. Trena Platt recommendation and repeat labs in 2 weeks after increasing dose.

## 2020-12-05 NOTE — Telephone Encounter (Signed)
Okay to increase the dose of methotrexate per Dr. Trena Platt recommendations.  Her labs have been stable.  I would recommend repeating labs in 2 weeks after increasing the dose of methotrexate which should include CBC with differential and CMP with GFR.

## 2020-12-05 NOTE — Telephone Encounter (Signed)
Patient is currently taking MTX 0.6 ml, patient will try to go  up to 0.8 ml.  Dr. Manuella Ghazi wants patient to increase to 1.0 ml weekly. Dr. Manuella Ghazi sent RX. Please advise.

## 2020-12-05 NOTE — Telephone Encounter (Signed)
Patient calling in regards to Opthamology appointment. Per patient eye doctor, Dr, Manuella Ghazi recommends increase MTX dosage. Please call to discuss.

## 2020-12-11 ENCOUNTER — Other Ambulatory Visit: Payer: Self-pay | Admitting: *Deleted

## 2020-12-11 ENCOUNTER — Telehealth: Payer: Self-pay | Admitting: *Deleted

## 2020-12-11 MED ORDER — METHOTREXATE SODIUM CHEMO INJECTION 50 MG/2ML: INTRAMUSCULAR | 0 refills | Status: DC

## 2020-12-11 NOTE — Telephone Encounter (Signed)
[place], Dr. Manuella Ghazi, note in Care Everywhere Appt: 12/03/3020  No active inflammation as of 12/03/2020. Advised to increase MTX to 0.8 ml and continue on Humira.  Reviewed by Hazel Sams, PA-C

## 2020-12-13 ENCOUNTER — Telehealth: Payer: Self-pay

## 2020-12-13 ENCOUNTER — Other Ambulatory Visit: Payer: Self-pay | Admitting: *Deleted

## 2020-12-13 DIAGNOSIS — Z79899 Other long term (current) drug therapy: Secondary | ICD-10-CM

## 2020-12-13 NOTE — Telephone Encounter (Signed)
Patient called requesting her labwork orders be sent to [facility] in [place].

## 2020-12-13 NOTE — Telephone Encounter (Signed)
released

## 2020-12-17 LAB — CBC WITH DIFFERENTIAL/PLATELET
Basophils Absolute: 0.1 10*3/uL (ref 0.0–0.2)
Basos: 1 %
EOS (ABSOLUTE): 0.1 10*3/uL (ref 0.0–0.4)
Eos: 1 %
Hematocrit: 36.9 % (ref 34.0–46.6)
Hemoglobin: 12 g/dL (ref 11.1–15.9)
Immature Grans (Abs): 0 10*3/uL (ref 0.0–0.1)
Immature Granulocytes: 0 %
Lymphocytes Absolute: 1.9 10*3/uL (ref 0.7–3.1)
Lymphs: 26 %
MCH: 30.5 pg (ref 26.6–33.0)
MCHC: 32.5 g/dL (ref 31.5–35.7)
MCV: 94 fL (ref 79–97)
Monocytes Absolute: 0.7 10*3/uL (ref 0.1–0.9)
Monocytes: 9 %
Neutrophils Absolute: 4.6 10*3/uL (ref 1.4–7.0)
Neutrophils: 63 %
Platelets: 462 10*3/uL — ABNORMAL HIGH (ref 150–450)
RBC: 3.93 x10E6/uL (ref 3.77–5.28)
RDW: 14 % (ref 11.7–15.4)
WBC: 7.3 10*3/uL (ref 3.4–10.8)

## 2020-12-17 LAB — CMP14+EGFR
ALT: 20 IU/L (ref 0–32)
AST: 19 IU/L (ref 0–40)
Albumin/Globulin Ratio: 1.8 (ref 1.2–2.2)
Albumin: 4 g/dL (ref 3.8–4.8)
Alkaline Phosphatase: 53 IU/L (ref 44–121)
BUN/Creatinine Ratio: 16 (ref 12–28)
BUN: 11 mg/dL (ref 8–27)
Bilirubin Total: <0.2 mg/dL (ref 0.0–1.2)
CO2: 24 mmol/L (ref 20–29)
Calcium: 9.1 mg/dL (ref 8.7–10.3)
Chloride: 101 mmol/L (ref 96–106)
Creatinine, Ser: 0.68 mg/dL (ref 0.57–1.00)
Globulin, Total: 2.2 g/dL (ref 1.5–4.5)
Glucose: 104 mg/dL — ABNORMAL HIGH (ref 65–99)
Potassium: 4.9 mmol/L (ref 3.5–5.2)
Sodium: 138 mmol/L (ref 134–144)
Total Protein: 6.2 g/dL (ref 6.0–8.5)
eGFR: 98 mL/min/{1.73_m2} (ref >59)

## 2020-12-17 NOTE — Progress Notes (Signed)
Platelets are mildly increased but stable.  Glucose is mildly increased probably not a fasting sample.  We will continue to monitor labs.

## 2020-12-18 ENCOUNTER — Other Ambulatory Visit: Payer: Self-pay

## 2020-12-18 ENCOUNTER — Encounter: Payer: Self-pay | Admitting: Rheumatology

## 2020-12-18 ENCOUNTER — Ambulatory Visit: Payer: BC Managed Care – PPO | Admitting: Rheumatology

## 2020-12-18 VITALS — BP 133/87 | HR 84 | Ht 66.5 in | Wt 196.4 lb

## 2020-12-18 DIAGNOSIS — K50919 Crohn's disease, unspecified, with unspecified complications: Secondary | ICD-10-CM | POA: Diagnosis not present

## 2020-12-18 DIAGNOSIS — M170 Bilateral primary osteoarthritis of knee: Secondary | ICD-10-CM

## 2020-12-18 DIAGNOSIS — R5383 Other fatigue: Secondary | ICD-10-CM

## 2020-12-18 DIAGNOSIS — Z79899 Other long term (current) drug therapy: Secondary | ICD-10-CM

## 2020-12-18 DIAGNOSIS — K7689 Other specified diseases of liver: Secondary | ICD-10-CM

## 2020-12-18 DIAGNOSIS — F439 Reaction to severe stress, unspecified: Secondary | ICD-10-CM

## 2020-12-18 DIAGNOSIS — H15102 Unspecified episcleritis, left eye: Secondary | ICD-10-CM

## 2020-12-18 DIAGNOSIS — U071 COVID-19: Secondary | ICD-10-CM

## 2020-12-18 DIAGNOSIS — D75839 Thrombocytosis, unspecified: Secondary | ICD-10-CM

## 2020-12-18 DIAGNOSIS — M4689 Other specified inflammatory spondylopathies, multiple sites in spine: Secondary | ICD-10-CM

## 2020-12-18 DIAGNOSIS — Z8709 Personal history of other diseases of the respiratory system: Secondary | ICD-10-CM

## 2020-12-18 DIAGNOSIS — M533 Sacrococcygeal disorders, not elsewhere classified: Secondary | ICD-10-CM | POA: Diagnosis not present

## 2020-12-18 DIAGNOSIS — Z8669 Personal history of other diseases of the nervous system and sense organs: Secondary | ICD-10-CM

## 2020-12-18 DIAGNOSIS — G8929 Other chronic pain: Secondary | ICD-10-CM | POA: Diagnosis not present

## 2020-12-18 DIAGNOSIS — M461 Sacroiliitis, not elsewhere classified: Secondary | ICD-10-CM

## 2020-12-18 DIAGNOSIS — I10 Essential (primary) hypertension: Secondary | ICD-10-CM

## 2020-12-18 DIAGNOSIS — E041 Nontoxic single thyroid nodule: Secondary | ICD-10-CM

## 2020-12-18 DIAGNOSIS — Z8719 Personal history of other diseases of the digestive system: Secondary | ICD-10-CM

## 2020-12-18 MED ORDER — TRIAMCINOLONE ACETONIDE 40 MG/ML IJ SUSP
40.0000 mg | INTRAMUSCULAR | Status: AC | PRN
  Administered 2020-12-18: 40 mg via INTRA_ARTICULAR

## 2020-12-18 MED ORDER — LIDOCAINE HCL 1 % IJ SOLN
1.5000 mL | INTRAMUSCULAR | Status: AC | PRN
  Administered 2020-12-18: 1.5 mL

## 2020-12-18 NOTE — Patient Instructions (Signed)
Standing Labs We placed an order today for your standing lab work.   Please have your standing labs drawn in August  If possible, please have your labs drawn 2 weeks prior to your appointment so that the provider can discuss your results at your appointment.  We have open lab daily Monday through Thursday from 1:30-4:30 PM and Friday from 1:30-4:00 PM at the office of Dr. Kaeden Mester, [facility].   Please be advised, all patients with office appointments requiring lab work will take precedents over walk-in lab work.  If possible, please come for your lab work on Monday and Friday afternoons, as you may experience shorter wait times. The office is located at [address], [address], [place], [place] [address] No appointment is necessary.   Labs are drawn by [facility]. Please bring your co-pay at the time of your lab draw.  You may receive a bill from [facility] for your lab work.  If you wish to have your labs drawn at another location, please call the office 24 hours in advance to send orders.  If you have any questions regarding directions or hours of operation,  please call [phone].   As a reminder, please drink plenty of water prior to coming for your lab work. Thanks!  

## 2021-01-01 ENCOUNTER — Other Ambulatory Visit: Payer: Self-pay | Admitting: Adult Health

## 2021-01-01 ENCOUNTER — Telehealth: Payer: Self-pay | Admitting: Adult Health

## 2021-01-01 DIAGNOSIS — K50919 Crohn's disease, unspecified, with unspecified complications: Secondary | ICD-10-CM

## 2021-01-01 NOTE — Telephone Encounter (Signed)
I called patient to discuss Evusheld, a long acting monoclonal antibody injection administered to patients with decreased immune systems or intolerance/allergy to the COVID 19 vaccine as COVID19 prevention.    Unable to reach patient.  LMOM to return my call.  Irelyn Perfecto, NP  

## 2021-01-01 NOTE — Progress Notes (Signed)
I connected by phone with Tammy Carey on 01/01/2021, 2:45 PM to discuss the potential use of a new treatment, tixagevimab/cilgavimab, for pre-exposure prophylaxis for prevention of coronavirus disease 2019 (COVID-19) caused by the SARS-CoV-2 virus.  This patient is a 65 y.o. female that meets the FDA criteria for Emergency Use Authorization of tixagevimab/cilgavimab for pre-exposure prophylaxis of COVID-19 disease. Pt meets following criteria:  Age >12 yr and weight > 40kg  Not currently infected with SARS-CoV-2 and has no known recent exposure to an individual infected with SARS-CoV-2 AND o Who has moderate to severe immune compromise due to a medical condition or receipt of immunosuppressive medications or treatments and may not mount an adequate immune response to COVID-19 vaccination or  o Vaccination with any available COVID-19 vaccine, according to the approved or authorized schedule, is not recommended due to a history of severe adverse reaction (e.g., severe allergic reaction) to a COVID-19 vaccine(s) and/or COVID-19 vaccine component(s).  o Patient meets the following definition of mod-severe immune compromised status: 8. Other groups not previously mentioned: 4. Crohns disease on immunosuppressive treatment.  I have spoken and communicated the following to the patient or parent/caregiver regarding COVID monoclonal antibody treatment:  1. FDA has authorized the emergency use of tixagevimab/cilgavimab for the pre-exposure prophylaxis of COVID-19 in patients with moderate-severe immunocompromised status, who meet above EUA criteria.  2. The significant known and potential risks and benefits of COVID monoclonal antibody, and the extent to which such potential risks and benefits are unknown.  3. Information on available alternative treatments and the risks and benefits of those alternatives, including clinical trials.  4. The patient or parent/caregiver has the option to accept or refuse  COVID monoclonal antibody treatment.  After reviewing this information with the patient, agree to receive tixagevimab/cilgavimab.  Added to [place] notified to schedule.    Scot Dock, NP, 01/01/2021, 2:45 PM

## 2021-01-03 ENCOUNTER — Ambulatory Visit (INDEPENDENT_AMBULATORY_CARE_PROVIDER_SITE_OTHER): Payer: BC Managed Care – PPO

## 2021-01-03 ENCOUNTER — Other Ambulatory Visit: Payer: Self-pay

## 2021-01-03 DIAGNOSIS — K50919 Crohn's disease, unspecified, with unspecified complications: Secondary | ICD-10-CM | POA: Diagnosis not present

## 2021-01-03 MED ORDER — DIPHENHYDRAMINE HCL 50 MG/ML IJ SOLN: 50.0000 mg | Freq: Once | INTRAMUSCULAR | Status: DC | PRN

## 2021-01-03 MED ORDER — TIXAGEVIMAB (PART OF EVUSHELD) INJECTION
300.0000 mg | Freq: Once | INTRAMUSCULAR | Status: AC
  Administered 2021-01-03: 300 mg via INTRAMUSCULAR

## 2021-01-03 MED ORDER — METHYLPREDNISOLONE SODIUM SUCC 125 MG IJ SOLR
125.0000 mg | Freq: Once | INTRAMUSCULAR | Status: DC | PRN
Start: 2021-01-03 — End: 2023-06-16

## 2021-01-03 MED ORDER — ALBUTEROL SULFATE HFA 108 (90 BASE) MCG/ACT IN AERS
2.0000 | INHALATION_SPRAY | Freq: Once | RESPIRATORY_TRACT | Status: DC | PRN
Start: 2021-01-03 — End: 2023-06-16

## 2021-01-03 MED ORDER — EPINEPHRINE 0.3 MG/0.3ML IJ SOAJ: 0.3000 mg | Freq: Once | INTRAMUSCULAR | Status: DC | PRN

## 2021-01-03 MED ORDER — CILGAVIMAB (PART OF EVUSHELD) INJECTION
300.0000 mg | Freq: Once | INTRAMUSCULAR | Status: AC
  Administered 2021-01-03: 300 mg via INTRAMUSCULAR

## 2021-01-03 NOTE — Progress Notes (Signed)
Diagnosis: Immunocompromised  Provider:  Marshell Garfinkel, MD  Procedure: Injection  Evusheld, Dose: 600mg , Site: intramuscular, 300mg  left gluteal x1, 300mg  right gluteal x2  Discharge: Condition: Good, Destination: Home . AVS provided to patient.   Performed by:  Koren Shiver, RN

## 2021-01-30 ENCOUNTER — Other Ambulatory Visit: Payer: Self-pay | Admitting: Gastroenterology

## 2021-01-30 DIAGNOSIS — K50919 Crohn's disease, unspecified, with unspecified complications: Secondary | ICD-10-CM

## 2021-03-03 ENCOUNTER — Telehealth: Payer: Self-pay

## 2021-03-03 DIAGNOSIS — Z79899 Other long term (current) drug therapy: Secondary | ICD-10-CM

## 2021-03-03 NOTE — Telephone Encounter (Signed)
Patient called requesting her labwork orders be faxed to Dr. Marco Collie office.  Patient states she has an appointment on Thursday, 03/06/21.  The office uses [facility].   Fax [phone]

## 2021-03-03 NOTE — Telephone Encounter (Signed)
Labs orders faxed to Dr. Nyra Capes office. I advised patient.

## 2021-03-06 NOTE — Progress Notes (Signed)
Office Visit Note  Patient: Tammy Carey             Date of Birth: 03/21/1956           MRN: [id number]             PCP: Marco Collie, MD Referring: Marco Collie, MD Visit Date: 03/20/2021 Occupation: '@GUAROCC'$ @  Subjective:  Medication management.   History of Present Illness: Christinia Policastro is a 65 y.o. female with history of Crohn's disease, chronic inflammatory arthritis and episcleritis.  She states she has been generally well on the combination of Humira and methotrexate.  She had good response to cortisone injections to her SI joints.  She continues to have some pain and stiffness in her hands and her knees.  She states that her right [facility] joint has been painful.  She also has some stiffness in her knees.  She is left-handed.  She is having difficulty drawing and injecting methotrexate.  She has not had a recurrence of episcleritis since last summer.  She has been followed by Dr. Manuella Ghazi.  She has been experiencing some lower back pain.  She is doing some stretching exercises.  She also feels some generalized muscle pain.  Activities of Daily Living:  Patient reports morning stiffness for 1-5 minutes.   Patient Reports nocturnal pain.  Difficulty dressing/grooming: Reports Difficulty climbing stairs: Reports Difficulty getting out of chair: Reports Difficulty using hands for taps, buttons, cutlery, and/or writing: Reports  Review of Systems  Constitutional:  Negative for fatigue.  HENT:  Negative for mouth sores, mouth dryness and nose dryness.   Eyes:  Negative for pain, itching, visual disturbance and dryness.  Respiratory:  Negative for cough, hemoptysis, shortness of breath and difficulty breathing.   Cardiovascular:  Negative for chest pain, palpitations and swelling in legs/feet.  Gastrointestinal:  Negative for abdominal pain, blood in stool, constipation and diarrhea.  Endocrine: Negative for increased urination.  Genitourinary:  Negative for painful urination.   Musculoskeletal:  Positive for morning stiffness. Negative for joint pain, joint pain, joint swelling, myalgias, muscle weakness, muscle tenderness and myalgias.  Skin:  Negative for color change, rash and redness.  Allergic/Immunologic: Negative for susceptible to infections.  Neurological:  Positive for headaches. Negative for dizziness, numbness, memory loss and weakness.  Hematological:  Negative for swollen glands.  Psychiatric/Behavioral:  Negative for confusion and sleep disturbance.    PMFS History:  Patient Active Problem List   Diagnosis Date Noted   Bilateral sacroiliitis ([place]) 03/20/2021   Primary osteoarthritis of both hands 03/20/2021   Primary osteoarthritis of both knees 03/20/2021   Hx of migraines 07/02/2020   History of asthma 07/02/2020   History of IBS 07/02/2020   History of diverticulitis 07/02/2020   Crohn's disease with complication ([place]) 123456   Essential hypertension 07/02/2020   Episcleritis of left eye 07/02/2020    Past Medical History:  Diagnosis Date   Anemia    Asthma    Crohn's disease ([place])    Hx of migraine headaches    Hypertension    Pulsatile tinnitus     Family History  Problem Relation Age of Onset   Hypertension Mother    Hypercholesterolemia Mother    Asthma Father    COPD Father    Prostate cancer Father    Hypertension Brother    Hypercholesterolemia Brother    ADD / ADHD Son    Rheum arthritis Cousin    Rheum arthritis Cousin    Past Surgical History:  Procedure  Laterality Date   APPENDECTOMY     BREAST ENHANCEMENT SURGERY     Per patient - in the 80's, replaced around 2008   COLONOSCOPY  11/05/2008   Mild colonic diverticulosis. Otherwise normal colonoscopy to TI. Minimal activity by endoscopic criteria.    ESOPHAGOGASTRODUODENOSCOPY  11/01/2008   Normal EGD.    LAPAROTOMY     for endometriosis   Social History   Social History Narrative   Not on file   Immunization History  Administered Date(s)  Administered   Moderna Sars-Covid-2 Vaccination 08/29/2020   PFIZER(Purple Top)SARS-COV-2 Vaccination 08/23/2019, 09/13/2019     Objective: Vital Signs: BP 114/71 (BP Location: Left Arm, Patient Position: Sitting, Cuff Size: Normal)   Pulse 79   Ht 5' 6.5" (1.689 m)   Wt 199 lb 3.2 oz (90.4 kg)   BMI 31.67 kg/m    Physical Exam Vitals and nursing note reviewed.  Constitutional:      Appearance: She is well-developed.  HENT:     Head: Normocephalic and atraumatic.  Eyes:     Conjunctiva/sclera: Conjunctivae normal.  Cardiovascular:     Rate and Rhythm: Normal rate and regular rhythm.     Heart sounds: Normal heart sounds.  Pulmonary:     Effort: Pulmonary effort is normal.     Breath sounds: Normal breath sounds.  Abdominal:     General: Bowel sounds are normal.     Palpations: Abdomen is soft.  Musculoskeletal:     Cervical back: Normal range of motion.  Lymphadenopathy:     Cervical: No cervical adenopathy.  Skin:    General: Skin is warm and dry.     Capillary Refill: Capillary refill takes less than 2 seconds.  Neurological:     Mental Status: She is alert and oriented to person, place, and time.  Psychiatric:        Behavior: Behavior normal.     Musculoskeletal Exam: C-spine was in good range of motion.  She had no tenderness over SI joints today.  Shoulder joints, elbow joints, wrist joints, MCPs PIPs and DIPs with good range of motion.  She had bilateral CMC prominence with right CMC tenderness.  She has some PIP and DIP thickening with no synovitis.  Hip joints and knee joints with good range of motion.  She had no tenderness over ankles or MTPs.  CDAI Exam: CDAI Score: -- Patient Global: --; Provider Global: -- Swollen: --; Tender: -- Joint Exam 03/20/2021   No joint exam has been documented for this visit   There is currently no information documented on the homunculus. Go to the Rheumatology activity and complete the homunculus joint  exam.  Investigation: No additional findings.  Imaging: No results found.  Recent Labs: Lab Results  Component Value Date   WBC 6.4 03/06/2021   HGB 11.7 03/06/2021   PLT 454 (H) 03/06/2021   NA 141 03/06/2021   K 5.1 03/06/2021   CL 103 03/06/2021   CO2 26 03/06/2021   GLUCOSE 89 03/06/2021   BUN 10 03/06/2021   CREATININE 0.62 03/06/2021   BILITOT 0.2 03/06/2021   ALKPHOS 50 03/06/2021   AST 15 03/06/2021   ALT 16 03/06/2021   PROT 6.1 03/06/2021   ALBUMIN 3.9 03/06/2021   CALCIUM 9.1 03/06/2021   GFRAA 111 09/04/2020   QFTBGOLDPLUS NEGATIVE 07/02/2020    Speciality Comments: No specialty comments available.  Procedures:  No procedures performed Allergies: Penicillins   Assessment / Plan:     Visit Diagnoses: Crohn's disease  with complication, unspecified gastrointestinal tract location [facility]) - Treated with Lialda in the past and now on Humira by Dr. Lyndel Safe.  She has not had a Crohn's flare in a while.  Chronic inflammatory arthritis-her arthritis is better controlled with the combination of Humira and methotrexate.  She had no synovitis on examination.  She is on methotrexate 0.8 mL subcu weekly.  She states injecting with needle and syringes has been difficult for her.  She has been asking for help from her neighbor and her husband.  She has osteoarthritis in her hands which causes discomfort.  We will try to apply for Rasuvo or OTREXUP 20 mg subcu weekly.  Episcleritis of left eye - Episcleritis every 8 weeks since 2020.Last episode 01/2020.  She is followed by Dr. Manuella Ghazi.  High risk medication use - Humira 40 mg subcu every 14 days, methotrexate 0.8 mL subcu injections once weekly, folic acid 2 mg po qd. - Plan: QuantiFERON-TB Gold Plus with her next labs which will be in November.  She will get labs every 3 months to monitor for drug toxicity.  She has been advised to get a skin examination yearly while she is on Humira to screen for nonmelanoma skin cancer.  She was  also advised to stop Humira and methotrexate in case she develops an infection and resume after infection resolves.  Information regarding vaccination was also placed in the AVS.  Bilateral sacroiliitis ([facility])-improved after cortisone injection.  Primary osteoarthritis of both hands-joint protection muscle strengthening was discussed.  She has been having right CMC discomfort..  Prescription for right CMC brace was given.  Primary osteoarthritis of both knees-lower extremity muscle strengthening exercises were discussed.  Chronic midline low back pain without sciatica-she is.  Seen some lower back pain.  Have given her a handout on back exercises that were also discussed in the office today.  Thrombocytosis-stable.  Thyroid nodule - Incidentally noted on the imaging study.  Hepatic cyst - Multiple probable hepatic cysts were noted on CT chest.   Other fatigue  History of diverticulitis - followed by Dr. Lyndel Safe  History of asthma  Essential hypertension  History of IBS  Hx of migraines  COVID-19 virus infection  Orders: Orders Placed This Encounter  Procedures   QuantiFERON-TB Gold Plus    Meds ordered this encounter  Medications   methotrexate 50 MG/2ML injection    Sig: Inject 0.8 mL into the skin once weekly.    Dispense:  4 mL    Refill:  0      Follow-Up Instructions: Return in about 3 months (around 06/20/2021) for Crohn's, arthritis, episcleritis.   Bo Merino, MD  Note - This record has been created using Editor, commissioning.  Chart creation errors have been sought, but may not always  have been located. Such creation errors do not reflect on  the standard of medical care.

## 2021-03-07 LAB — CBC WITH DIFFERENTIAL/PLATELET
Basophils Absolute: 0.1 10*3/uL (ref 0.0–0.2)
Basos: 1 %
EOS (ABSOLUTE): 0.1 10*3/uL (ref 0.0–0.4)
Eos: 2 %
Hematocrit: 35.5 % (ref 34.0–46.6)
Hemoglobin: 11.7 g/dL (ref 11.1–15.9)
Immature Grans (Abs): 0 10*3/uL (ref 0.0–0.1)
Immature Granulocytes: 0 %
Lymphocytes Absolute: 2.5 10*3/uL (ref 0.7–3.1)
Lymphs: 39 %
MCH: 31 pg (ref 26.6–33.0)
MCHC: 33 g/dL (ref 31.5–35.7)
MCV: 94 fL (ref 79–97)
Monocytes Absolute: 0.6 10*3/uL (ref 0.1–0.9)
Monocytes: 9 %
Neutrophils Absolute: 3.2 10*3/uL (ref 1.4–7.0)
Neutrophils: 49 %
Platelets: 454 10*3/uL — ABNORMAL HIGH (ref 150–450)
RBC: 3.77 x10E6/uL (ref 3.77–5.28)
RDW: 14.9 % (ref 11.7–15.4)
WBC: 6.4 10*3/uL (ref 3.4–10.8)

## 2021-03-07 LAB — CMP14+EGFR
ALT: 16 IU/L (ref 0–32)
AST: 15 IU/L (ref 0–40)
Albumin/Globulin Ratio: 1.8 (ref 1.2–2.2)
Albumin: 3.9 g/dL (ref 3.8–4.8)
Alkaline Phosphatase: 50 IU/L (ref 44–121)
BUN/Creatinine Ratio: 16 (ref 12–28)
BUN: 10 mg/dL (ref 8–27)
Bilirubin Total: 0.2 mg/dL (ref 0.0–1.2)
CO2: 26 mmol/L (ref 20–29)
Calcium: 9.1 mg/dL (ref 8.7–10.3)
Chloride: 103 mmol/L (ref 96–106)
Creatinine, Ser: 0.62 mg/dL (ref 0.57–1.00)
Globulin, Total: 2.2 g/dL (ref 1.5–4.5)
Glucose: 89 mg/dL (ref 65–99)
Potassium: 5.1 mmol/L (ref 3.5–5.2)
Sodium: 141 mmol/L (ref 134–144)
Total Protein: 6.1 g/dL (ref 6.0–8.5)
eGFR: 99 mL/min/{1.73_m2} (ref >59)

## 2021-03-07 NOTE — Progress Notes (Signed)
CMP is normal.  CBC is normal except platelets are mildly elevated and stable.

## 2021-03-20 ENCOUNTER — Encounter: Payer: Self-pay | Admitting: Rheumatology

## 2021-03-20 ENCOUNTER — Other Ambulatory Visit (HOSPITAL_COMMUNITY): Payer: Self-pay

## 2021-03-20 ENCOUNTER — Other Ambulatory Visit: Payer: Self-pay

## 2021-03-20 ENCOUNTER — Telehealth: Payer: Self-pay

## 2021-03-20 ENCOUNTER — Ambulatory Visit: Payer: BC Managed Care – PPO | Admitting: Rheumatology

## 2021-03-20 VITALS — BP 114/71 | HR 79 | Ht 66.5 in | Wt 199.2 lb

## 2021-03-20 DIAGNOSIS — K50919 Crohn's disease, unspecified, with unspecified complications: Secondary | ICD-10-CM | POA: Diagnosis not present

## 2021-03-20 DIAGNOSIS — E041 Nontoxic single thyroid nodule: Secondary | ICD-10-CM

## 2021-03-20 DIAGNOSIS — D75839 Thrombocytosis, unspecified: Secondary | ICD-10-CM

## 2021-03-20 DIAGNOSIS — Z79899 Other long term (current) drug therapy: Secondary | ICD-10-CM

## 2021-03-20 DIAGNOSIS — H15102 Unspecified episcleritis, left eye: Secondary | ICD-10-CM | POA: Diagnosis not present

## 2021-03-20 DIAGNOSIS — Z8719 Personal history of other diseases of the digestive system: Secondary | ICD-10-CM

## 2021-03-20 DIAGNOSIS — M170 Bilateral primary osteoarthritis of knee: Secondary | ICD-10-CM | POA: Insufficient documentation

## 2021-03-20 DIAGNOSIS — U071 COVID-19: Secondary | ICD-10-CM

## 2021-03-20 DIAGNOSIS — M545 Low back pain, unspecified: Secondary | ICD-10-CM

## 2021-03-20 DIAGNOSIS — M461 Sacroiliitis, not elsewhere classified: Secondary | ICD-10-CM

## 2021-03-20 DIAGNOSIS — M19042 Primary osteoarthritis, left hand: Secondary | ICD-10-CM

## 2021-03-20 DIAGNOSIS — G8929 Other chronic pain: Secondary | ICD-10-CM

## 2021-03-20 DIAGNOSIS — Z8709 Personal history of other diseases of the respiratory system: Secondary | ICD-10-CM

## 2021-03-20 DIAGNOSIS — M19041 Primary osteoarthritis, right hand: Secondary | ICD-10-CM

## 2021-03-20 DIAGNOSIS — M1990 Unspecified osteoarthritis, unspecified site: Secondary | ICD-10-CM

## 2021-03-20 DIAGNOSIS — R5383 Other fatigue: Secondary | ICD-10-CM

## 2021-03-20 DIAGNOSIS — I10 Essential (primary) hypertension: Secondary | ICD-10-CM

## 2021-03-20 DIAGNOSIS — Z8669 Personal history of other diseases of the nervous system and sense organs: Secondary | ICD-10-CM

## 2021-03-20 DIAGNOSIS — K7689 Other specified diseases of liver: Secondary | ICD-10-CM

## 2021-03-20 MED ORDER — METHOTREXATE SODIUM CHEMO INJECTION 50 MG/2ML: INTRAMUSCULAR | 0 refills | Status: DC

## 2021-03-20 NOTE — Telephone Encounter (Signed)
Patient is currently injecting methotrexate vial and syringe. Please apply for rasuvo/otrexup, per Dr. Estanislado Pandy. Thanks!

## 2021-03-20 NOTE — Patient Instructions (Addendum)
Standing Labs We placed an order today for your standing lab work.   Please have your standing labs drawn in November and every 3 months  TB Gold with next lab  If possible, please have your labs drawn 2 weeks prior to your appointment so that the provider can discuss your results at your appointment.  Please note that you may see your imaging and lab results in [place] before we have reviewed them. We may be awaiting multiple results to interpret others before contacting you. Please allow our office up to 72 hours to thoroughly review all of the results before contacting the office for clarification of your results.  We have open lab daily: Monday through Thursday from 1:30-4:30 PM and Friday from 1:30-4:00 PM at the office of Dr. Bo Merino, [facility].   Please be advised, all patients with office appointments requiring lab work will take precedent over walk-in lab work.  If possible, please come for your lab work on Monday and Friday afternoons, as you may experience shorter wait times. The office is located at [address] [place], [place], [place] [address] No appointment is necessary.   Labs are drawn by [facility]. Please bring your co-pay at the time of your lab draw.  You may receive a bill from [facility] for your lab work.  If you wish to have your labs drawn at another location, please call the office 24 hours in advance to send orders.  If you have any questions regarding directions or hours of operation,  please call [phone].   As a reminder, please drink plenty of water prior to coming for your lab work. Thanks!   If you test POSITIVE for COVID19 and have MILD to MODERATE symptoms: First, call your PCP if you would like to receive COVID19 treatment AND Hold your medications during the infection and for at least 1 week after your symptoms have resolved: Injectable medication (Benlysta, Cimzia, Cosentyx, Enbrel, Humira, Orencia, Remicade, Simponi,  Stelara, Taltz, Tremfya) Methotrexate Leflunomide (Arava) Azathioprine Mycophenolate (Cellcept) Roma Kayser, or Rinvoq Otezla If you take Actemra or Kevzara, you DO NOT need to hold these for COVID19 infection.  If you test POSITIVE for COVID19 and have NO symptoms: First, call your PCP if you would like to receive COVID19 treatment AND Hold your medications for at least 10 days after the day that you tested positive Injectable medication (Benlysta, Cimzia, Cosentyx, Enbrel, Humira, Orencia, Remicade, Simponi, Stelara, Taltz, Tremfya) Methotrexate Leflunomide (Arava) Azathioprine Mycophenolate (Cellcept) Roma Kayser, or Rinvoq Otezla If you take Actemra or Kevzara, you DO NOT need to hold these for COVID19 infection.  If you have signs or symptoms of an infection or start antibiotics: First, call your PCP for workup of your infection. Hold your medication through the infection, until you complete your antibiotics, and until symptoms resolve if you take the following: Injectable medication (Actemra, Benlysta, Cimzia, Cosentyx, Enbrel, Humira, Kevzara, Orencia, Remicade, Simponi, Stelara, Taltz, Tremfya) Methotrexate Leflunomide (Arava) Mycophenolate (Cellcept) Morrie Sheldon, Olumiant, or Rinvoq  COVID-19 vaccine recommendations:   COVID-19 vaccine is recommended for everyone (unless you are allergic to a vaccine component), even if you are on a medication that suppresses your immune system.   If you are on Methotrexate, Cellcept (mycophenolate), Rinvoq, Morrie Sheldon, and Olumiant- hold the medication for 1 week after each vaccine. Hold Methotrexate for 2 weeks after the single dose COVID-19 vaccine.   If you are on Orencia subcutaneous injection - hold medication one week prior to and one week after the  first COVID-19 vaccine dose (only).   If you are on Orencia IV infusions- time vaccination administration so that the first COVID-19 vaccination will occur four weeks after the  infusion and postpone the subsequent infusion by one week.   If you are on Cyclophosphamide or Rituxan infusions please contact your doctor prior to receiving the COVID-19 vaccine.   Do not take Tylenol or any anti-inflammatory medications (NSAIDs) 24 hours prior to the COVID-19 vaccination.   There is no direct evidence about the efficacy of the COVID-19 vaccine in individuals who are on medications that suppress the immune system.   Even if you are fully vaccinated, and you are on any medications that suppress your immune system, please continue to wear a mask, maintain at least six feet social distance and practice hand hygiene.   If you develop a COVID-19 infection, please contact your PCP or our office to determine if you need monoclonal antibody infusion.  The booster vaccine is now available for immunocompromised patients.   Please see the following web sites for updated information.   [email or web address]-Patient-Resources.[email or web address]  Vaccines You are taking a medication(s) that can suppress your immune system.  The following immunizations are recommended: Flu annually Covid-19  Td/Tdap (tetanus, diphtheria, pertussis) every 10 years Pneumonia (Prevnar 15 then Pneumovax 23 at least 1 year apart.  Alternatively, can take Prevnar 20 without needing additional dose) Shingrix (after age 62): 2 doses from 4 weeks to 6 months apart  Please check with your PCP to make sure you are up to date.   Heart Disease Prevention   Your inflammatory disease increases your risk of heart disease which includes heart attack, stroke, atrial fibrillation (irregular heartbeats), high blood pressure, heart failure and atherosclerosis (plaque in the arteries).  It is important to reduce your risk by:   Keep blood pressure, cholesterol, and blood sugar at healthy levels   Smoking Cessation   Maintain a healthy weight  BMI 20-25   Eat a healthy diet  Plenty  of fresh fruit, vegetables, and whole grains  Limit saturated fats, foods high in sodium, and added sugars  DASH and Mediterranean diet   Increase physical activity  Recommend moderate physically activity for 150 minutes per week/ 30 minutes a day for five days a week These can be broken up into three separate ten-minute sessions during the day.   Reduce Stress  Meditation, slow breathing exercises, yoga, coloring books  Dental visits twice a year   Back Exercises The following exercises strengthen the muscles that help to support the trunk and back. They also help to keep the lower back flexible. Doing these exercises can help to prevent back pain or lessen existing pain. If you have back pain or discomfort, try doing these exercises 2-3 times each day or as told by your health care provider. As your pain improves, do them once each day, but increase the number of times that you repeat the steps for each exercise (do more repetitions). To prevent the recurrence of back pain, continue to do these exercises once each day or as told by your health care provider. Do exercises exactly as told by your health care provider and adjust them as directed. It is normal to feel mild stretching, pulling, tightness, or discomfort as you do these exercises, but you should stop right away if youfeel sudden pain or your pain gets worse. Exercises Single knee to chest Repeat these steps 3-5 times for each leg: Lie on your back on a firm bed or  the floor with your legs extended. Bring one knee to your chest. Your other leg should stay extended and in contact with the floor. Hold your knee in place by grabbing your knee or thigh with both hands and hold. Pull on your knee until you feel a gentle stretch in your lower back or buttocks. Hold the stretch for 10-30 seconds. Slowly release and straighten your leg. Pelvic tilt Repeat these steps 5-10 times: Lie on your back on a firm bed or the floor with your legs  extended. Bend your knees so they are pointing toward the ceiling and your feet are flat on the floor. Tighten your lower abdominal muscles to press your lower back against the floor. This motion will tilt your pelvis so your tailbone points up toward the ceiling instead of pointing to your feet or the floor. With gentle tension and even breathing, hold this position for 5-10 seconds. Cat-cow Repeat these steps until your lower back becomes more flexible: Get into a hands-and-knees position on a firm surface. Keep your hands under your shoulders, and keep your knees under your hips. You may place padding under your knees for comfort. Let your head hang down toward your chest. Contract your abdominal muscles and point your tailbone toward the floor so your lower back becomes rounded like the back of a cat. Hold this position for 5 seconds. Slowly lift your head, let your abdominal muscles relax and point your tailbone up toward the ceiling so your back forms a sagging arch like the back of a cow. Hold this position for 5 seconds.  Press-ups Repeat these steps 5-10 times: Lie on your abdomen (face-down) on the floor. Place your palms near your head, about shoulder-width apart. Keeping your back as relaxed as possible and keeping your hips on the floor, slowly straighten your arms to raise the top half of your body and lift your shoulders. Do not use your back muscles to raise your upper torso. You may adjust the placement of your hands to make yourself more comfortable. Hold this position for 5 seconds while you keep your back relaxed. Slowly return to lying flat on the floor.  Bridges Repeat these steps 10 times: Lie on your back on a firm surface. Bend your knees so they are pointing toward the ceiling and your feet are flat on the floor. Your arms should be flat at your sides, next to your body. Tighten your buttocks muscles and lift your buttocks off the floor until your waist is at almost  the same height as your knees. You should feel the muscles working in your buttocks and the back of your thighs. If you do not feel these muscles, slide your feet 1-2 inches farther away from your buttocks. Hold this position for 3-5 seconds. Slowly lower your hips to the starting position, and allow your buttocks muscles to relax completely. If this exercise is too easy, try doing it with your arms crossed over yourchest. Abdominal crunches Repeat these steps 5-10 times: Lie on your back on a firm bed or the floor with your legs extended. Bend your knees so they are pointing toward the ceiling and your feet are flat on the floor. Cross your arms over your chest. Tip your chin slightly toward your chest without bending your neck. Tighten your abdominal muscles and slowly raise your trunk (torso) high enough to lift your shoulder blades a tiny bit off the floor. Avoid raising your torso higher than that because it can put too  much stress on your low back and does not help to strengthen your abdominal muscles. Slowly return to your starting position. Back lifts Repeat these steps 5-10 times: Lie on your abdomen (face-down) with your arms at your sides, and rest your forehead on the floor. Tighten the muscles in your legs and your buttocks. Slowly lift your chest off the floor while you keep your hips pressed to the floor. Keep the back of your head in line with the curve in your back. Your eyes should be looking at the floor. Hold this position for 3-5 seconds. Slowly return to your starting position. Contact a health care provider if: Your back pain or discomfort gets much worse when you do an exercise. Your worsening back pain or discomfort does not lessen within 2 hours after you exercise. If you have any of these problems, stop doing these exercises right away. Do not do them again unless your health care provider says that you can. Get help right away if: You develop sudden, severe back  pain. If this happens, stop doing the exercises right away. Do not do them again unless your health care provider says that you can. This information is not intended to replace advice given to you by your health care provider. Make sure you discuss any questions you have with your healthcare provider. Document Revised: 11/24/2018 Document Reviewed: 04/21/2018 Elsevier Patient Education  [place].

## 2021-03-20 NOTE — Telephone Encounter (Signed)
Initiated a Prior Authorization request to [place] for OTREXUP via CoverMyMeds.   Awaiting clinical questions to generate.   KeyRon Parker)

## 2021-03-21 NOTE — Telephone Encounter (Signed)
Received a fax regarding Prior Authorization from [place] for [place]. Authorization has been DENIED because Current plan approved criteria does not allow coverage of Otrexup unless the patient has one of the following diagnoses: rheumatoid arthritis, polyarticular juvenile idiopathic arthritis, or psoriasis.

## 2021-03-21 NOTE — Telephone Encounter (Signed)
Will file appeal for patient's Otrexup

## 2021-03-21 NOTE — Telephone Encounter (Signed)
PA submitted. Awaiting response. 

## 2021-03-26 NOTE — Telephone Encounter (Signed)
URGENT Otrexup appeal faxed to [facility]  Fax: [phone] Phone: [phone]  Knox Saliva, PharmD, MPH, BCPS Clinical Pharmacist (Rheumatology and Pulmonology)

## 2021-03-31 NOTE — Telephone Encounter (Signed)
Urgent appeal submitted to [facility] for Orexup.  Phone: [phone] Fax: [phone]  Ref #: [id number]

## 2021-04-02 NOTE — Telephone Encounter (Signed)
Received fax from [facility]) regarding Otrexup appeal. Appeal was denied by appeal analyst and medical director. No further appeal review available for physicians or providers.  Case ID: [id number] Appeal Case Manager Merilyn Baba): [id number]  Called patient to advise on update. She states she is comfortable with her husband (who is a Marine scientist) and her friend (also a Marine scientist) helping to draw up MTX from vial and syringe.   She expressed appreciation for appealing on her behalf. Routing to Dr. Estanislado Pandy as Juluis Rainier and will close encounter.  Knox Saliva, PharmD, MPH, BCPS Clinical Pharmacist (Rheumatology and Pulmonology)

## 2021-04-12 ENCOUNTER — Other Ambulatory Visit: Payer: Self-pay | Admitting: Rheumatology

## 2021-04-14 NOTE — Telephone Encounter (Signed)
Next Visit: 06/30/2021  Last Visit: 03/20/2021  Last Fill: 03/20/2021   DX: Crohn's disease with complication, unspecified gastrointestinal tract location   Current Dose per office note 03/20/2021:  methotrexate 0.8 mL subcu weekly  Labs: 03/06/2021 CMP is normal.  CBC is normal except platelets are mildly elevated and stable.  Okay to refill MTX?

## 2021-04-29 ENCOUNTER — Other Ambulatory Visit: Payer: Self-pay | Admitting: Rheumatology

## 2021-04-29 NOTE — Telephone Encounter (Signed)
Next Visit: 06/30/2021   Last Visit: 03/20/2021   Last Fill: 03/20/2021    DX: Crohn's disease with complication, unspecified gastrointestinal tract location    Current Dose per office note 0/73/5430:  folic acid 2 mg po qd  Okay to refill Folic Acid?

## 2021-05-02 ENCOUNTER — Telehealth: Payer: Self-pay

## 2021-05-02 NOTE — Telephone Encounter (Signed)
Patient advised she does not need to hold MTX after the flu vaccine. Patient expressed understanding.

## 2021-05-02 NOTE — Telephone Encounter (Signed)
Patient left a voicemail requesting a return call to let her know how to handle her Methotrexate injection after a flu vaccine.

## 2021-06-08 ENCOUNTER — Other Ambulatory Visit: Payer: Self-pay | Admitting: Physician Assistant

## 2021-06-08 DIAGNOSIS — Z79899 Other long term (current) drug therapy: Secondary | ICD-10-CM

## 2021-06-09 NOTE — Telephone Encounter (Signed)
Next Visit: 06/30/2021   Last Visit: 03/20/2021   Last Fill: 04/14/2021 (Pharmacy filled with PF)  DX: Crohn's disease with complication, unspecified gastrointestinal tract location    Current Dose per office note 03/20/2021: methotrexate 0.8 mL subcu injections once weekly  Labs: 03/06/2021 CMP is normal.  CBC is normal except platelets are mildly elevated and stable.  Patient advised she is due to update labs. Patient will update this week.   Okay to refill MTX?

## 2021-06-16 NOTE — Progress Notes (Signed)
Office Visit Note  Patient: Tammy Carey             Date of Birth: 04-04-1956           MRN: [id number]             PCP: Marco Collie, MD Referring: Marco Collie, MD Visit Date: 06/30/2021 Occupation: @GUAROCC @  Subjective:  Medication management.   History of Present Illness: [name] is a 65 y.o. female with a history of Crohn's disease, osteoarthritis, episcleritis and sacroiliitis.  She is doing well on combination of Humira and methotrexate.  She experiences fatigue after the methotrexate dose.  She continues to have some lower back pain.  She denies any flare of Crohn's disease or apical scleritis.  She continues to have some stiffness in her joints but no joint swelling.  Activities of Daily Living:  Patient reports morning stiffness for 0 minutes.   Patient Denies nocturnal pain.  Difficulty dressing/grooming: Denies Difficulty climbing stairs: Denies Difficulty getting out of chair: Reports Difficulty using hands for taps, buttons, cutlery, and/or writing: Denies  Review of Systems  Constitutional:  Positive for fatigue.  HENT:  Negative for mouth sores, mouth dryness and nose dryness.   Eyes:  Negative for pain, itching and dryness.  Respiratory:  Negative for shortness of breath and difficulty breathing.   Cardiovascular:  Negative for chest pain and palpitations.  Gastrointestinal:  Positive for nausea. Negative for blood in stool, constipation and diarrhea.  Endocrine: Negative for increased urination.  Genitourinary:  Negative for difficulty urinating.  Musculoskeletal:  Positive for myalgias and myalgias. Negative for joint pain, joint pain, joint swelling, morning stiffness and muscle tenderness.  Skin:  Negative for color change, rash and redness.  Allergic/Immunologic: Positive for susceptible to infections.  Neurological:  Negative for dizziness, numbness, headaches, memory loss and weakness.  Hematological:  Positive for bruising/bleeding tendency.   Psychiatric/Behavioral:  Negative for confusion.    PMFS History:  Patient Active Problem List   Diagnosis Date Noted   Bilateral sacroiliitis ([place]) 03/20/2021   Primary osteoarthritis of both hands 03/20/2021   Primary osteoarthritis of both knees 03/20/2021   Hx of migraines 07/02/2020   History of asthma 07/02/2020   History of IBS 07/02/2020   History of diverticulitis 07/02/2020   Crohn's disease with complication ([place]) 95/04/3266   Essential hypertension 07/02/2020   Episcleritis of left eye 07/02/2020    Past Medical History:  Diagnosis Date   Anemia    Asthma    Crohn's disease ([place])    Hx of migraine headaches    Hypertension    Pulsatile tinnitus     Family History  Problem Relation Age of Onset   Hypertension Mother    Hypercholesterolemia Mother    Asthma Father    COPD Father    Prostate cancer Father    Hypertension Brother    Hypercholesterolemia Brother    ADD / ADHD Son    Rheum arthritis Cousin    Rheum arthritis Cousin    Past Surgical History:  Procedure Laterality Date   APPENDECTOMY     BREAST ENHANCEMENT SURGERY     Per patient - in the 80's, replaced around 2008   COLONOSCOPY  11/05/2008   Mild colonic diverticulosis. Otherwise normal colonoscopy to TI. Minimal activity by endoscopic criteria.    ESOPHAGOGASTRODUODENOSCOPY  11/01/2008   Normal EGD.    LAPAROTOMY     for endometriosis   Social History   Social History Narrative   Not on  file   Immunization History  Administered Date(s) Administered   [employer] Vaccination 08/29/2020   PFIZER(Purple Top)SARS-COV-2 Vaccination 08/23/2019, 09/13/2019     Objective: Vital Signs: BP 139/87 (BP Location: Left Arm, Patient Position: Sitting, Cuff Size: Normal)   Pulse 67   Ht 5' 6.5" (1.689 m)   Wt 194 lb 3.2 oz (88.1 kg)   BMI 30.88 kg/m    Physical Exam Vitals and nursing note reviewed.  Constitutional:      Appearance: She is well-developed.  HENT:     Head:  Normocephalic and atraumatic.  Eyes:     Conjunctiva/sclera: Conjunctivae normal.  Cardiovascular:     Rate and Rhythm: Normal rate and regular rhythm.     Heart sounds: Normal heart sounds.  Pulmonary:     Effort: Pulmonary effort is normal.     Breath sounds: Normal breath sounds.  Abdominal:     General: Bowel sounds are normal.     Palpations: Abdomen is soft.  Musculoskeletal:     Cervical back: Normal range of motion.  Lymphadenopathy:     Cervical: No cervical adenopathy.  Skin:    General: Skin is warm and dry.     Capillary Refill: Capillary refill takes less than 2 seconds.  Neurological:     Mental Status: She is alert and oriented to person, place, and time.  Psychiatric:        Behavior: Behavior normal.     Musculoskeletal Exam: C-spine was in good range of motion.  She had discomfort range of motion of her lumbar spine.  Shoulder joints, elbow joints, wrist joints were in good range of motion.  She had bilateral PIP and DIP thickening with no synovitis.  Hip joints and knee joints with good range of motion with no synovitis.  There was no evidence plantar fasciitis or Achilles tendinitis.  CDAI Exam: CDAI Score: -- Patient Global: --; Provider Global: -- Swollen: --; Tender: -- Joint Exam 06/30/2021   No joint exam has been documented for this visit   There is currently no information documented on the homunculus. Go to the Rheumatology activity and complete the homunculus joint exam.  Investigation: No additional findings.  Imaging: No results found.  Recent Labs: Lab Results  Component Value Date   WBC 7.0 06/16/2021   HGB 12.3 06/16/2021   PLT 450 06/16/2021   NA 139 06/16/2021   K 4.8 06/16/2021   CL 101 06/16/2021   CO2 25 06/16/2021   GLUCOSE 103 (H) 06/16/2021   BUN 12 06/16/2021   CREATININE 0.67 06/16/2021   BILITOT 0.2 06/16/2021   ALKPHOS 58 06/16/2021   AST 15 06/16/2021   ALT 14 06/16/2021   PROT 6.6 06/16/2021   ALBUMIN 4.3  06/16/2021   CALCIUM 9.3 06/16/2021   GFRAA 111 09/04/2020   QFTBGOLDPLUS NEGATIVE 07/02/2020    Speciality Comments: No specialty comments available.  Procedures:  No procedures performed Allergies: Penicillins   Assessment / Plan:     Visit Diagnoses: Crohn's disease with complication, unspecified gastrointestinal tract location [facility]) - Treated with Lialda in the past and now on Humira by Dr. Lyndel Safe.  Patient states her Crohn's disease symptoms are very well controlled.  Chronic inflammatory arthritis-she had no active synovitis on examination today.  Episcleritis of left eye - Episcleritis every 8 weeks since 2020.Last episode 01/2020.  She is followed by Dr. Manuella Ghazi.  High risk medication use - Humira 40 mg subcu every 14 days, methotrexate 0.8 mL subcu injections once weekly, folic  acid 2 mg po qd. -labs obtained on June 16, 2021 were reviewed which included CBC with differential and CMP with GFR.  She was advised to get labs every 3 months.  TB gold was negative on July 02, 2020.  She is advised to get TB Gold with her next labs.  She was also advised to hold Humira and methotrexate in case she develops an infection and restart after the infection resolves.  She is up-to-date on her immunization.  Information about immunization was also placed in the AVS.  Plan: QuantiFERON-TB Gold Plus  Bilateral sacroiliitis ([facility]) - improved after cortisone injection.  Primary osteoarthritis of both hands-joint protection muscle strengthening was discussed.  Primary osteoarthritis of both knees-joint protection muscle strengthening was discussed.  Chronic midline low back pain without sciatica-she continues to have some discomfort in her lower back.  Thrombocytosis-platelet count is normal now.  Hepatic cyst - Multiple probable hepatic cysts were noted on CT chest.   Thyroid nodule - Incidentally noted on the imaging study.  Other fatigue  Hx of migraines  Essential  hypertension  History of diverticulitis - followed by Dr. Lyndel Safe  History of asthma  History of IBS  COVID-19 virus infection  Orders: Orders Placed This Encounter  Procedures   QuantiFERON-TB Gold Plus    No orders of the defined types were placed in this encounter.    Follow-Up Instructions: Return in about 5 months (around 11/28/2021) for Crohn's disease, Osteoarthritis.   Bo Merino, MD  Note - This record has been created using Editor, commissioning.  Chart creation errors have been sought, but may not always  have been located. Such creation errors do not reflect on  the standard of medical care.

## 2021-06-17 LAB — CBC WITH DIFFERENTIAL/PLATELET
Basophils Absolute: 0.1 10*3/uL (ref 0.0–0.2)
Basos: 1 %
EOS (ABSOLUTE): 0.1 10*3/uL (ref 0.0–0.4)
Eos: 1 %
Hematocrit: 37.5 % (ref 34.0–46.6)
Hemoglobin: 12.3 g/dL (ref 11.1–15.9)
Immature Grans (Abs): 0 10*3/uL (ref 0.0–0.1)
Immature Granulocytes: 0 %
Lymphocytes Absolute: 2.4 10*3/uL (ref 0.7–3.1)
Lymphs: 34 %
MCH: 31.5 pg (ref 26.6–33.0)
MCHC: 32.8 g/dL (ref 31.5–35.7)
MCV: 96 fL (ref 79–97)
Monocytes Absolute: 0.6 10*3/uL (ref 0.1–0.9)
Monocytes: 8 %
Neutrophils Absolute: 3.9 10*3/uL (ref 1.4–7.0)
Neutrophils: 56 %
Platelets: 450 10*3/uL (ref 150–450)
RBC: 3.9 x10E6/uL (ref 3.77–5.28)
RDW: 13.7 % (ref 11.7–15.4)
WBC: 7 10*3/uL (ref 3.4–10.8)

## 2021-06-17 LAB — CMP14+EGFR
ALT: 14 IU/L (ref 0–32)
AST: 15 IU/L (ref 0–40)
Albumin/Globulin Ratio: 1.9 (ref 1.2–2.2)
Albumin: 4.3 g/dL (ref 3.8–4.8)
Alkaline Phosphatase: 58 IU/L (ref 44–121)
BUN/Creatinine Ratio: 18 (ref 12–28)
BUN: 12 mg/dL (ref 8–27)
Bilirubin Total: 0.2 mg/dL (ref 0.0–1.2)
CO2: 25 mmol/L (ref 20–29)
Calcium: 9.3 mg/dL (ref 8.7–10.3)
Chloride: 101 mmol/L (ref 96–106)
Creatinine, Ser: 0.67 mg/dL (ref 0.57–1.00)
Globulin, Total: 2.3 g/dL (ref 1.5–4.5)
Glucose: 103 mg/dL — ABNORMAL HIGH (ref 70–99)
Potassium: 4.8 mmol/L (ref 3.5–5.2)
Sodium: 139 mmol/L (ref 134–144)
Total Protein: 6.6 g/dL (ref 6.0–8.5)
eGFR: 98 mL/min/{1.73_m2} (ref >59)

## 2021-06-17 NOTE — Progress Notes (Signed)
CBC and CMP are normal.

## 2021-06-19 ENCOUNTER — Other Ambulatory Visit: Payer: Self-pay | Admitting: Gastroenterology

## 2021-06-19 DIAGNOSIS — K50919 Crohn's disease, unspecified, with unspecified complications: Secondary | ICD-10-CM

## 2021-06-25 ENCOUNTER — Telehealth: Payer: Self-pay

## 2021-06-25 NOTE — Telephone Encounter (Signed)
Received PA Approval for Humira 40mg  by [facility]  for 06/25/2021 - 06/25/2022.

## 2021-06-30 ENCOUNTER — Encounter: Payer: Self-pay | Admitting: Rheumatology

## 2021-06-30 ENCOUNTER — Other Ambulatory Visit: Payer: Self-pay

## 2021-06-30 ENCOUNTER — Ambulatory Visit: Payer: BC Managed Care – PPO | Admitting: Rheumatology

## 2021-06-30 VITALS — BP 139/87 | HR 67 | Ht 66.5 in | Wt 194.2 lb

## 2021-06-30 DIAGNOSIS — M19041 Primary osteoarthritis, right hand: Secondary | ICD-10-CM

## 2021-06-30 DIAGNOSIS — H15102 Unspecified episcleritis, left eye: Secondary | ICD-10-CM

## 2021-06-30 DIAGNOSIS — M545 Low back pain, unspecified: Secondary | ICD-10-CM

## 2021-06-30 DIAGNOSIS — Z79899 Other long term (current) drug therapy: Secondary | ICD-10-CM

## 2021-06-30 DIAGNOSIS — E041 Nontoxic single thyroid nodule: Secondary | ICD-10-CM

## 2021-06-30 DIAGNOSIS — D75839 Thrombocytosis, unspecified: Secondary | ICD-10-CM

## 2021-06-30 DIAGNOSIS — Z8669 Personal history of other diseases of the nervous system and sense organs: Secondary | ICD-10-CM

## 2021-06-30 DIAGNOSIS — Z8719 Personal history of other diseases of the digestive system: Secondary | ICD-10-CM

## 2021-06-30 DIAGNOSIS — I10 Essential (primary) hypertension: Secondary | ICD-10-CM

## 2021-06-30 DIAGNOSIS — R5383 Other fatigue: Secondary | ICD-10-CM

## 2021-06-30 DIAGNOSIS — M1990 Unspecified osteoarthritis, unspecified site: Secondary | ICD-10-CM | POA: Diagnosis not present

## 2021-06-30 DIAGNOSIS — K50919 Crohn's disease, unspecified, with unspecified complications: Secondary | ICD-10-CM

## 2021-06-30 DIAGNOSIS — Z8709 Personal history of other diseases of the respiratory system: Secondary | ICD-10-CM

## 2021-06-30 DIAGNOSIS — K7689 Other specified diseases of liver: Secondary | ICD-10-CM

## 2021-06-30 DIAGNOSIS — M19042 Primary osteoarthritis, left hand: Secondary | ICD-10-CM

## 2021-06-30 DIAGNOSIS — G8929 Other chronic pain: Secondary | ICD-10-CM

## 2021-06-30 DIAGNOSIS — M461 Sacroiliitis, not elsewhere classified: Secondary | ICD-10-CM

## 2021-06-30 DIAGNOSIS — M170 Bilateral primary osteoarthritis of knee: Secondary | ICD-10-CM

## 2021-06-30 DIAGNOSIS — U071 COVID-19: Secondary | ICD-10-CM

## 2021-06-30 NOTE — Patient Instructions (Addendum)
Standing Labs We placed an order today for your standing lab work.   Please have your standing labs drawn in February and every 3 months  TB Gold with your next labs  If possible, please have your labs drawn 2 weeks prior to your appointment so that the provider can discuss your results at your appointment.  Please note that you may see your imaging and lab results in [place] before we have reviewed them. We may be awaiting multiple results to interpret others before contacting you. Please allow our office up to 72 hours to thoroughly review all of the results before contacting the office for clarification of your results.  We have open lab daily: Monday through Thursday from 1:30-4:30 PM and Friday from 1:30-4:00 PM at the office of Dr. Bo Merino, [facility].   Please be advised, all patients with office appointments requiring lab work will take precedent over walk-in lab work.  If possible, please come for your lab work on Monday and Friday afternoons, as you may experience shorter wait times. The office is located at [address] [place], [place], [place] [address] No appointment is necessary.   Labs are drawn by [facility]. Please bring your co-pay at the time of your lab draw.  You may receive a bill from [facility] for your lab work.  If you wish to have your labs drawn at another location, please call the office 24 hours in advance to send orders.  If you have any questions regarding directions or hours of operation,  please call [phone].   As a reminder, please drink plenty of water prior to coming for your lab work. Thanks!   Vaccines You are taking a medication(s) that can suppress your immune system.  The following immunizations are recommended: Flu annually Covid-19  Td/Tdap (tetanus, diphtheria, pertussis) every 10 years Pneumonia (Prevnar 15 then Pneumovax 23 at least 1 year apart.  Alternatively, can take Prevnar 20 without needing additional  dose) Shingrix: 2 doses from 4 weeks to 6 months apart  Please check with your PCP to make sure you are up to date.   If you have signs or symptoms of an infection or start antibiotics: First, call your PCP for workup of your infection. Hold your medication through the infection, until you complete your antibiotics, and until symptoms resolve if you take the following: Injectable medication (Actemra, Benlysta, Cimzia, Cosentyx, Enbrel, Humira, Kevzara, Orencia, Remicade, Simponi, Stelara, Taltz, Tremfya) Methotrexate Leflunomide (Arava) Mycophenolate (Cellcept) Morrie Sheldon, Olumiant, or Rinvoq

## 2021-08-18 ENCOUNTER — Other Ambulatory Visit: Payer: Self-pay | Admitting: Rheumatology

## 2021-08-18 MED ORDER — METHOTREXATE SODIUM CHEMO INJECTION (PF) 50 MG/2ML: INTRAMUSCULAR | 2 refills | Status: DC

## 2021-08-18 NOTE — Telephone Encounter (Signed)
Patient called the office stating she is almost out of Methotrexate and would [facility] [place] on [address]

## 2021-08-18 NOTE — Telephone Encounter (Signed)
Next Visit: 11/24/2021  Last Visit: 06/30/2021  Last Fill: 06/09/2021  DX: Crohn's disease with complication, unspecified gastrointestinal tract location   Current Dose per office note 06/30/2021: methotrexate 0.8 mL subcu injections once weekly  Labs: 06/16/2021 CBC and CMP are normal.  Okay to refill MTX?

## 2021-08-29 ENCOUNTER — Telehealth: Payer: Self-pay | Admitting: Rheumatology

## 2021-08-29 NOTE — Telephone Encounter (Signed)
Patient called the office stating she is having a potential side effect to Methotrexate. Patient states she is unsure if its to MTX but she wanted to speak with Devki just incase. Patient states its a minor side effect and doesn't need an urgent response. Patient states she just has a few questions.

## 2021-09-01 NOTE — Telephone Encounter (Signed)
Patient reached out regarding question about methotrexate - she states she is having muscle spasms and cramps in her legs that have recently worsened. She states she was tolerating methotrexate fine when she first started and wanted to confirm cramps are not medication-induced. I advised her to reach out to PCP for further work-up. She verbalized understanding.  Knox Saliva, PharmD, MPH, BCPS Clinical Pharmacist (Rheumatology and Pulmonology)

## 2021-09-15 ENCOUNTER — Ambulatory Visit: Payer: BC Managed Care – PPO | Admitting: Gastroenterology

## 2021-10-01 HISTORY — PX: KNEE SURGERY: SHX244

## 2021-10-01 IMAGING — DX DG CHEST 2V
2 series · 2 of 2 positions shown · non-contrast
Comparison: 08/24/2016

CLINICAL DATA: Immunosuppressive therapy

EXAM:
CHEST - 2 VIEW

[w chest pa]
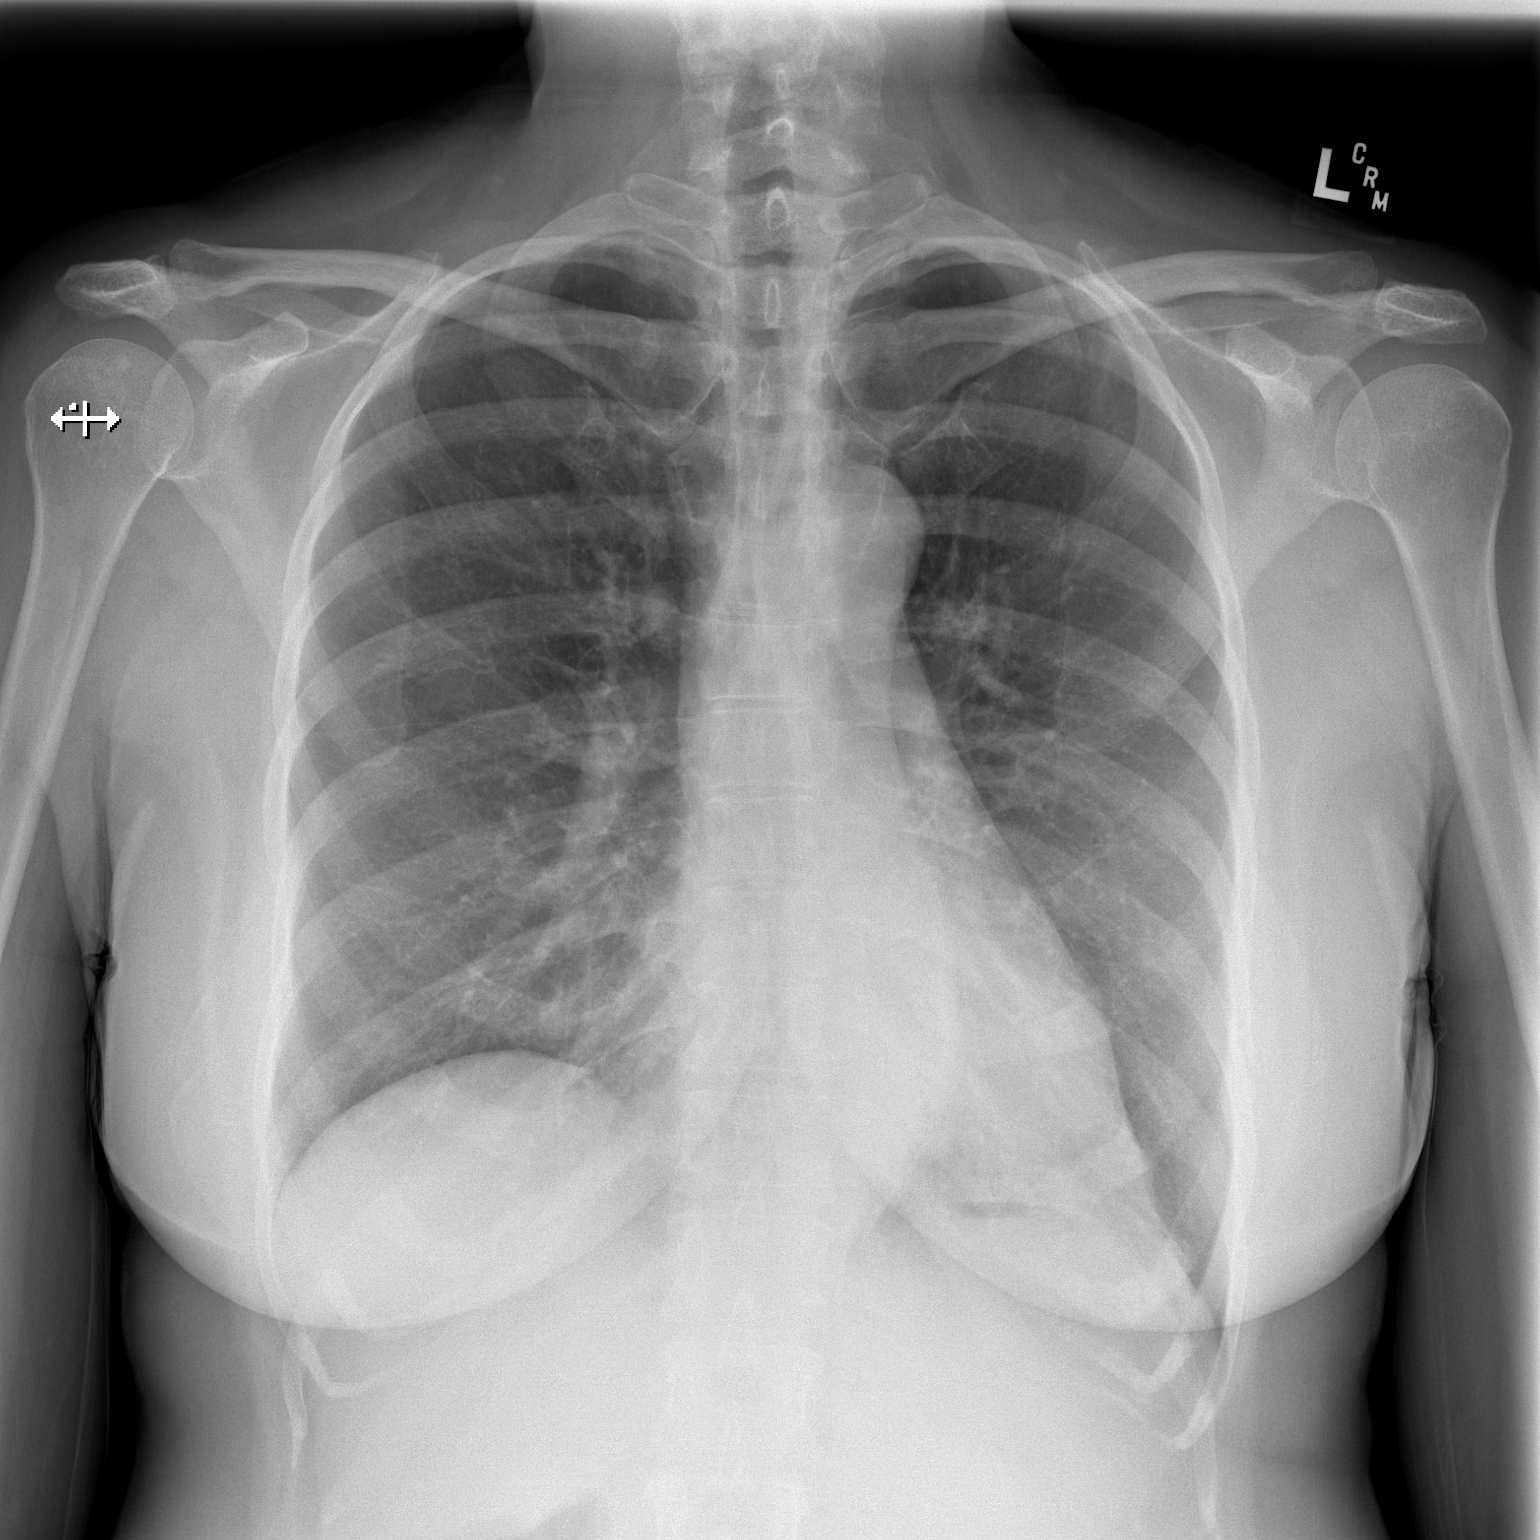

[w chest lat]
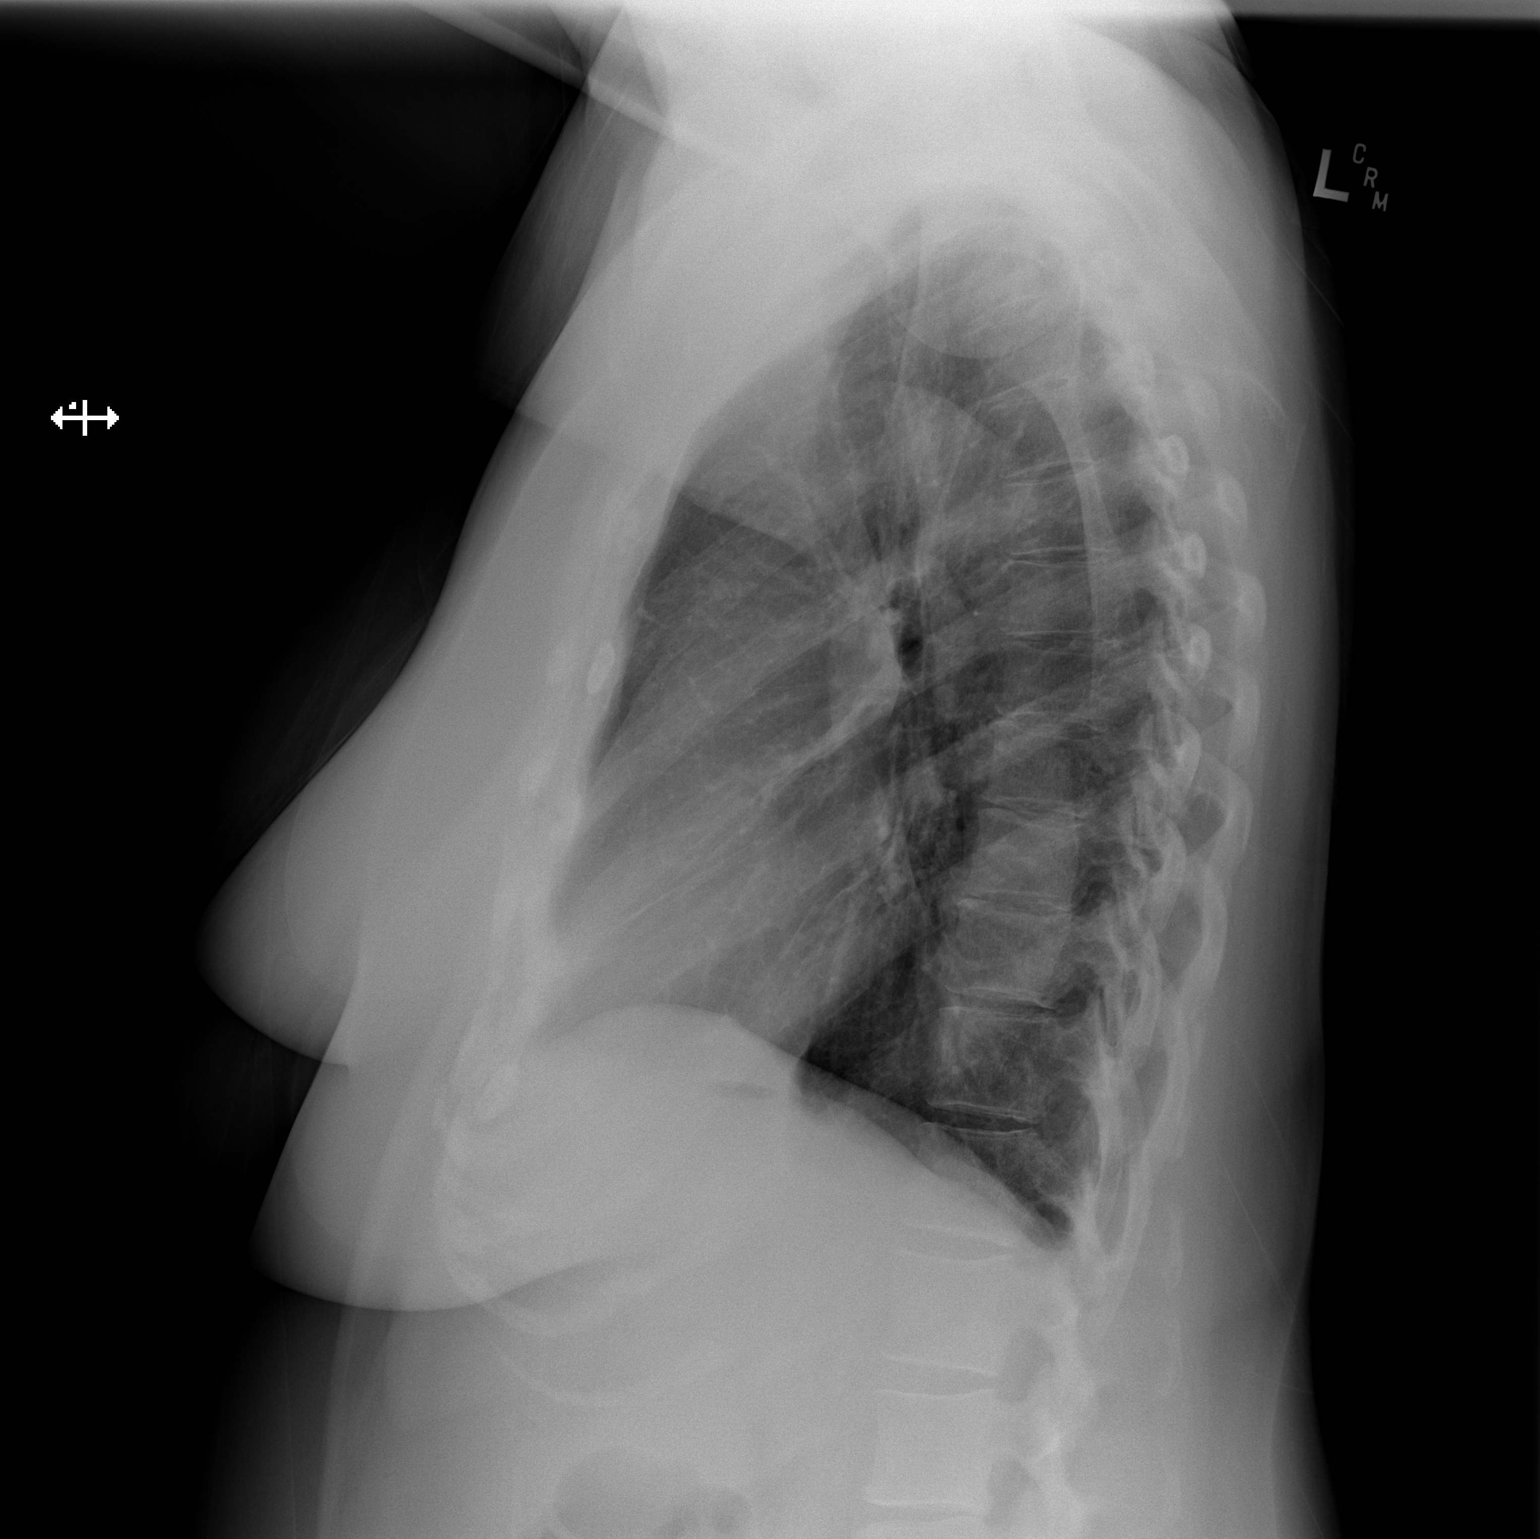

[2 of 2 positions shown; findings below may reference images not displayed]

FINDINGS: The heart size and mediastinal contours are within normal limits.
Rounded 10 mm nodular density projects within the inferomedial
aspect of the right lung base. Lungs are otherwise clear. No pleural
effusion or pneumothorax. The visualized skeletal structures are
unremarkable.
IMPRESSION: 1. No acute cardiopulmonary findings.
2. Rounded 10 mm nodular density projects within the inferomedial
aspect of the right lung base. Recommend CT of the chest for further
evaluation.

## 2021-10-07 ENCOUNTER — Telehealth: Payer: Self-pay

## 2021-10-07 ENCOUNTER — Ambulatory Visit (HOSPITAL_BASED_OUTPATIENT_CLINIC_OR_DEPARTMENT_OTHER)
Admission: RE | Admit: 2021-10-07 | Discharge: 2021-10-07 | Disposition: A | Discharge status: Home / Self Care | Payer: BC Managed Care – PPO | Source: Ambulatory Visit | Attending: Gastroenterology | Admitting: Gastroenterology

## 2021-10-07 ENCOUNTER — Ambulatory Visit: Payer: BC Managed Care – PPO | Admitting: Gastroenterology

## 2021-10-07 ENCOUNTER — Other Ambulatory Visit: Payer: Self-pay | Admitting: Gastroenterology

## 2021-10-07 ENCOUNTER — Encounter: Payer: Self-pay | Admitting: Gastroenterology

## 2021-10-07 ENCOUNTER — Other Ambulatory Visit: Payer: Self-pay

## 2021-10-07 VITALS — BP 112/70 | HR 85 | Ht 66.5 in | Wt 199.0 lb

## 2021-10-07 DIAGNOSIS — K580 Irritable bowel syndrome with diarrhea: Secondary | ICD-10-CM | POA: Diagnosis present

## 2021-10-07 DIAGNOSIS — K50919 Crohn's disease, unspecified, with unspecified complications: Secondary | ICD-10-CM | POA: Diagnosis not present

## 2021-10-07 DIAGNOSIS — Z8719 Personal history of other diseases of the digestive system: Secondary | ICD-10-CM | POA: Insufficient documentation

## 2021-10-07 DIAGNOSIS — K5732 Diverticulitis of large intestine without perforation or abscess without bleeding: Secondary | ICD-10-CM

## 2021-10-07 MED ORDER — IOHEXOL 300 MG/ML  SOLN
100.0000 mL | Freq: Once | INTRAMUSCULAR | Status: AC | PRN
  Administered 2021-10-07: 100 mL via INTRAVENOUS

## 2021-10-07 MED ORDER — DOXYCYCLINE HYCLATE 100 MG PO TABS: 100.0000 mg | ORAL_TABLET | Freq: Two times a day (BID) | ORAL | Status: AC

## 2021-10-07 NOTE — Progress Notes (Signed)
CT scan Abdo/pelvis today showed acute sigmoid diverticulitis ?She is immunocompromised given she is on methotrexate/Humira ?No definite colon wall thickening in the ascending colon. ? ?Plan: ?Doxycycline 100 mg p.o. twice daily x 14 days (she is allergic to penicillin and does not want to take Cipro d/t recent meniscus problems and tendinitis) ? ?Discussed with the patient and patient's husband over the phone. ? ?RG ?

## 2021-10-07 NOTE — Telephone Encounter (Signed)
Patient advised she should hold methotrexate for 1 week prior to surgery.  She will require clearance to restart methotrexate by her orthopedic surgeon after surgery.  Patient expressed understanding.  ?

## 2021-10-07 NOTE — Progress Notes (Addendum)
? ? ?Chief Complaint: Recent diverticulitis ? ?Referring Provider: Dr. Marco Collie    ? ? ?ASSESSMENT AND PLAN;  ? ?#1. Crohn's disease: Dx 2006, involving R colon. ?#2. Extraintestinal manifestations- episcleritis and sacroiliitis. ?#3. H/O diverticulitis (CT 07/2020 cipro and flagyl)). ?#4. 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 ?Plan: ? ?- CT AP with PO/IV contrast stat ?- Humira '40mg'$  Q weekly for now. ?- Blood work from Dr Nyra Capes ?- MTX added per rheumatology Jan 2022.  Lets continue for now. ?- Appt with Dr. Darrel [facility] clinic at [facility].  Aizlynn knows her personally as well. ? ? ?Addendum: ?-CT AP showed acute diverticulitis without abscess.  No colonic wall thickening suggestive of any Crohn's exacerbation. ?-White cell count was elevated.  CRP was elevated ?-She is allergic to penicillin and wants to avoid taking Cipro.  Also she is immunocompromised.  Started on doxycycline 100 mg p.o. twice daily x 2 weeks ?-Resume Humira every 2 weekly for now.  We will call her over the phone before the next dose. ? ? ?HPI:   ? ?Tammy Carey is a 66 y.o. female  ?For follow-up visit. ? ?Lower abdominal pain-moderate to severe, crampy ?No associated diarrhea.  Had only 1 episode prior to abdominal pain.  Now she is more constipated. ?No fever chills or night sweats. ? ?Had right meniscal tear, followed by Dr. Janeice Robinson.  Was given Percocet as needed for pain.  She had some relief of abdominal pain as well but not completely. ? ? ? ?Previous GI work-up: ?Pertinent labs: From previous notes ?-TB Gold 07/02/2020: neg ?-HBsAg ?-HCV-07/02/2020: neg ?-Sed rate 06/2020 ?-TPMT Nl 06/2018 ? ?Colonoscopy 05/04/2018 ?- A few erosions in the ascending colon and in the cecum. Biopsied. ?- Moderate left colonic diverticulosis. ?- Non-bleeding internal hemorrhoids. ?- Nl TI ?- Path: ?1. Surgical [P], terminal ileum ?- SMALL BOWEL MUCOSA WITH LYMPHOID AGGREGATES ?- NO  ACUTE INFLAMMATION, GRANULOMAS OR MALIGNANCY IDENTIFIED ?2. Surgical [P], ascending and cecum ?- CHRONIC ACTIVE COLITIS WITH ULCER ?- NO GRANULOMATA, DYSPLASIA OR MALIGNANCY IDENTIFIED ?3. Surgical [P], left side ?- BENIGN COLONIC MUCOSA WITH MINIMAL ARCHITECTURE DISTORTION ?- NO ACTIVE INFLAMMATION, HIGH GRADE DYSPLASIA OR MALIGNANCY IDENTIFIED ? ?-Colonoscopy ([facility]) 11/05/2008: Mild sigmoid diverticulosis, otherwise normal colon to TI, neg TI and random colonic biopsies. ?Past Medical History:  ?Diagnosis Date  ? Anemia   ? Asthma   ? Crohn's disease ([place])   ? Hx of migraine headaches   ? Hypertension   ? Pulsatile tinnitus   ? ? ?Past Surgical History:  ?Procedure Laterality Date  ? APPENDECTOMY    ? BREAST ENHANCEMENT SURGERY    ? Per patient - in the 80's, replaced around 2008  ? COLONOSCOPY  11/05/2008  ? Mild colonic diverticulosis. Otherwise normal colonoscopy to TI. Minimal activity by endoscopic criteria.   ? ESOPHAGOGASTRODUODENOSCOPY  11/01/2008  ? Normal EGD.   ? LAPAROTOMY    ? for endometriosis  ? ? ?Family History  ?Problem Relation Age of Onset  ? Hypertension Mother   ? Hypercholesterolemia Mother   ? Asthma Father   ? COPD Father   ? Prostate cancer Father   ? Hypertension Brother   ? Hypercholesterolemia Brother   ? ADD / ADHD Son   ? Rheum arthritis Cousin   ? Rheum arthritis Cousin   ? ? ?Social History  ?[place] ?Tobacco Use  ? Smoking status: Never Smoker  ? Smokeless tobacco: Never Used  ?  Substance Use Topics  ? Alcohol use: Yes  ?  Alcohol/week: 3.0 standard drinks  ?  Types: 3 Standard drinks or equivalent per week  ? Drug use: Never  ? ? ?Current Outpatient Medications  ?Medication Sig Dispense Refill  ? AMBULATORY NON FORMULARY MEDICATION as needed. GI cocktail    ? DULoxetine (CYMBALTA) 30 MG capsule Take 30 mg by mouth daily.    ? estradiol (ESTRACE) 0.5 MG tablet Take 0.5 mg by mouth daily.    ? folic acid (FOLVITE) 1 MG tablet TAKE 2 TABLETS BY MOUTH EVERY DAY 180 tablet  2  ? HUMIRA PEN 40 MG/0.4ML PNKT INJECT 1 PEN UNDER THE SKIN EVERY 14 DAYS. 2 each 3  ? Methotrexate Sodium (METHOTREXATE, PF,) 50 MG/2ML injection INJECT 0.8 ML INTO THE SKIN ONCE WEEKLY. 8 mL 2  ? NURTEC 75 MG TBDP Take by mouth daily as needed.    ? omeprazole (PRILOSEC) 40 MG capsule Take 1 capsule by mouth daily as needed.    ? progesterone (PROMETRIUM) 100 MG capsule 100 mg daily.     ? Tuberculin-Allergy Syringes 27G X 1/2" 1 ML MISC Use 1 syringe once weekly to inject methotrexate. 12 each 3  ? rosuvastatin (CRESTOR) 5 MG tablet Take 5 mg by mouth daily. (Patient not taking: Reported on 10/07/2021)    ? ?Current Facility-Administered Medications  ?Medication Dose Route Frequency Provider Last Rate Last Admin  ? albuterol (VENTOLIN HFA) 108 (90 Base) MCG/ACT inhaler 2 puff  2 puff Inhalation Once PRN Causey, Charlestine Massed, NP      ? diphenhydrAMINE (BENADRYL) injection 50 mg  50 mg Intramuscular Once PRN Causey, Charlestine Massed, NP      ? EPINEPHrine (EPI-PEN) injection 0.3 mg  0.3 mg Intramuscular Once PRN Gardenia Phlegm, NP      ? methylPREDNISolone sodium succinate (SOLU-MEDROL) 125 mg/2 mL injection 125 mg  125 mg Intramuscular Once PRN Gardenia Phlegm, NP      ? ? ?Allergies  ?Allergen Reactions  ? Penicillins Other (See Comments)  ?  Pt. Stated," I just know I am."  ? ? ?Review of Systems:  ?neg ? ?  ? ?Physical Exam:   ? ?BP 112/70   Pulse 85   Ht 5' 6.5" (1.689 m)   Wt 199 lb (90.3 kg)   SpO2 98%   BMI 31.64 kg/m?  ?Filed Weights  ? 10/07/21 1050  ?Weight: 199 lb (90.3 kg)  ? ?Constitutional:  Well-developed, in no acute distress. ?Psychiatric: Normal mood and affect. Behavior is normal. ?HEENT: Pupils normal.  Conjunctivae are normal. No scleral icterus. ?Cardiovascular: Normal rate, regular rhythm. No edema ?Pulmonary/chest: Effort normal and breath sounds normal. No wheezing, rales or rhonchi. ?Abdominal: Soft, nondistended. LLQ tenderness without rebound.  Bowel sounds  active throughout. There are no masses palpable. No hepatomegaly. ?Rectal:  defered ?Neurological: Alert and oriented to person place and time. ?Skin: Skin is warm and dry. No rashes noted. ? ? ? ? ?[name], MD 10/07/2021, 10:56 AM ? ?Cc: Dr. Marco Collie ? ? ?

## 2021-10-07 NOTE — Telephone Encounter (Signed)
Attempted to contact the patient and left message for patient to call the office.  

## 2021-10-07 NOTE — Telephone Encounter (Signed)
Patient called stating she tore her right meniscus which will require surgery.  Patient states she is not sure when it is going to be scheduled, but wanted to make sure she didn't have to discontinue her Methotrexate medication.  Patient requested a return call.   ?

## 2021-10-07 NOTE — Patient Instructions (Signed)
If you are age 65 or older, your body mass index should be between 23-30. Your Body mass index is 31.64 kg/m?Marland Kitchen If this is out of the aforementioned range listed, please consider follow up with your Primary Care Provider. ? ?If you are age 24 or younger, your body mass index should be between 19-25. Your Body mass index is 31.64 kg/m?Marland Kitchen If this is out of the aformentioned range listed, please consider follow up with your Primary Care Provider.  ? ?________________________________________________________ ? ?The [facility] providers would like to encourage you to use [facility] to communicate with providers for non-urgent requests or questions.  Due to long hold times on the telephone, sending your provider a message by [facility] may be a faster and more efficient way to get a response.  Please allow 48 business hours for a response.  Please remember that this is for non-urgent requests.  ?_______________________________________________________ ? ?You have been scheduled for a CT [facility] [place] [address] [address] [facility]  ? ?You are scheduled on       at            . You should arrive 15 minutes prior to your appointment time for registration. Please follow the written instructions below on the day of your exam: ? ?WARNING: IF YOU ARE ALLERGIC TO IODINE/X-RAY DYE, PLEASE NOTIFY RADIOLOGY IMMEDIATELY AT [phone]! YOU WILL BE GIVEN A 13 HOUR PREMEDICATION PREP. ? ?1) Do not eat or drink anything after         (4 hours prior to your test) ?2) You have been given 2 bottles of oral contrast to drink. The solution may taste better if refrigerated, but do NOT add ice or any other liquid to this solution. Shake well before drinking. ?  ? Drink 1 bottle of contrast @          (2 hours prior to your exam) ? Drink 1 bottle of contrast @            (1 hour prior to your exam) ? ?You may take any medications as prescribed with a small amount of water, if  necessary. If you take any of the following medications: METFORMIN, GLUCOPHAGE, GLUCOVANCE, AVANDAMET, RIOMET, FORTAMET, ACTOPLUS MET, JANUMET, [place] or METAGLIP, you MAY be asked to HOLD this medication 48 hours AFTER the exam. ? ?The purpose of you drinking the oral contrast is to aid in the visualization of your intestinal tract. The contrast solution may cause some diarrhea. Depending on your individual set of symptoms, you may also receive an intravenous injection of x-ray contrast/dye. Plan on being at [facility] for 30 minutes or longer, depending on the type of exam you are having performed. ? ?This test typically takes 30-45 minutes to complete. ? ?If you have any questions regarding your exam or if you need to reschedule, you may call the CT department at [phone] between the hours of 8:00 am and 5:00 pm, Monday-Friday. ? ?________________________________________________________________________ ? ?

## 2021-10-17 IMAGING — CT CT CHEST W/O CM
2 of 4 series · 15 of 36 positions shown, 18 images · non-contrast
Comparison: July 17, 2020.

CLINICAL DATA: Lung nodule.

EXAM:
CT CHEST WITHOUT CONTRAST
TECHNIQUE: Multidetector CT imaging of the chest was performed following the
standard protocol without IV contrast.

[Series 2: thorax · axial · 0.74mm/px · z∈[+1400,+1688]mm · 12 of 172 slices shown, 15 images]
[im 14/172  mediastinal]
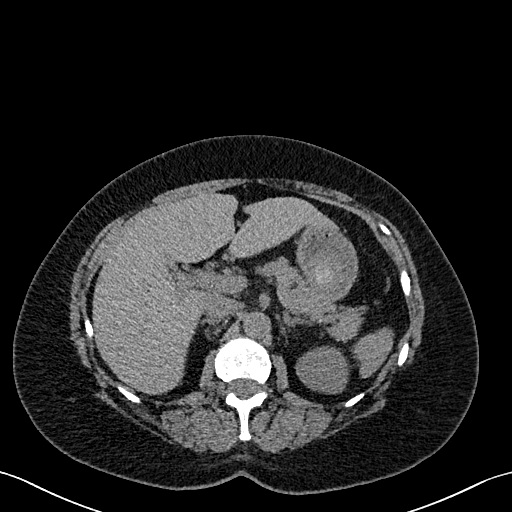
[im 14/172  lung]
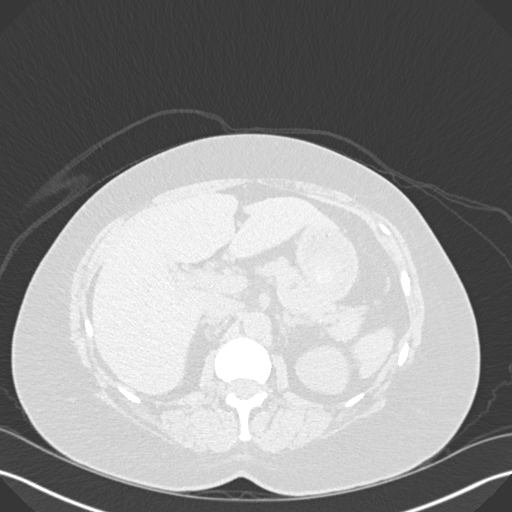
[im 27/172  lung]
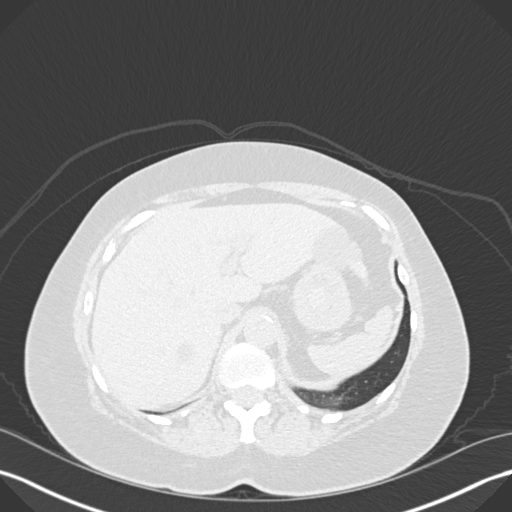
[im 40/172  lung]
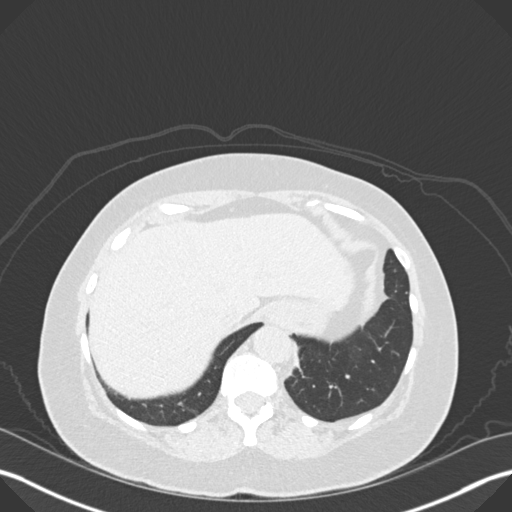
[im 53/172  lung]
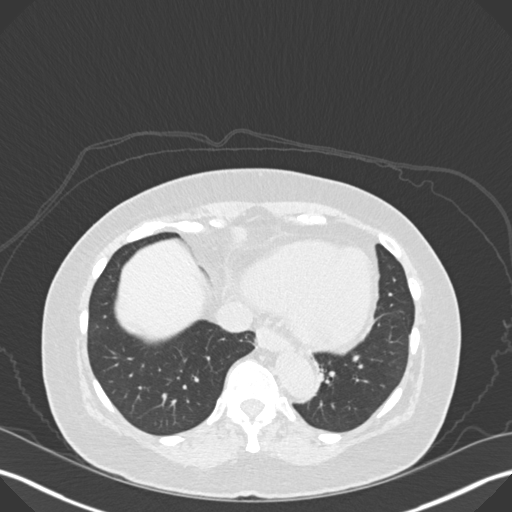
[im 66/172  mediastinal]
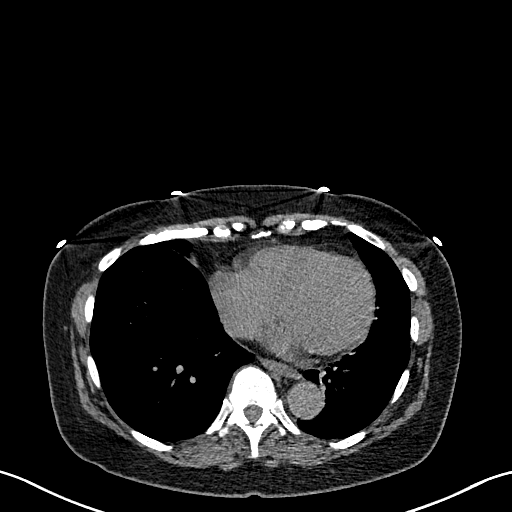
[im 66/172  lung]
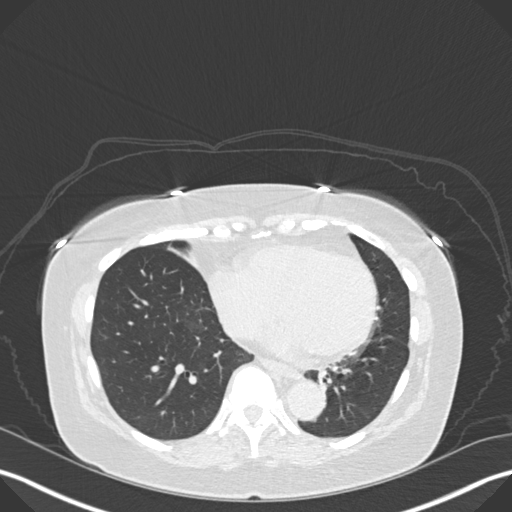
[im 79/172  lung]
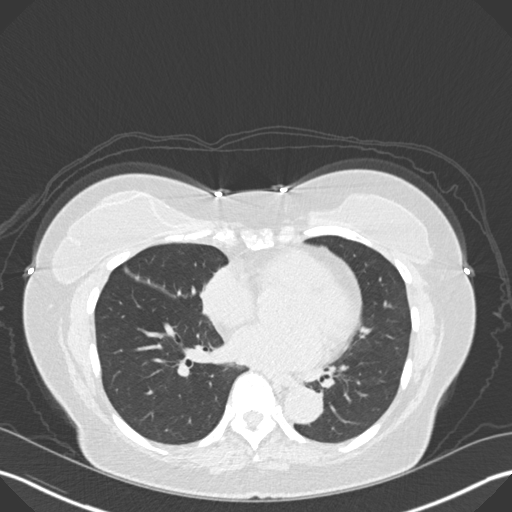
[im 93/172  lung]
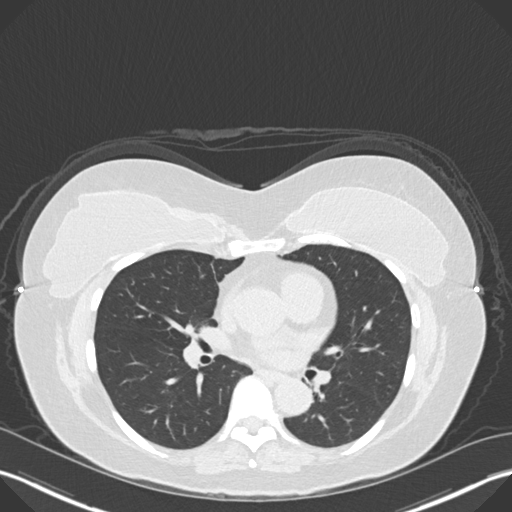
[im 106/172  lung]
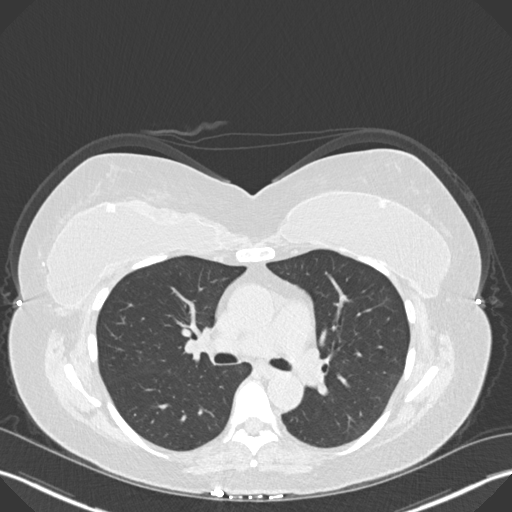
[im 119/172  mediastinal]
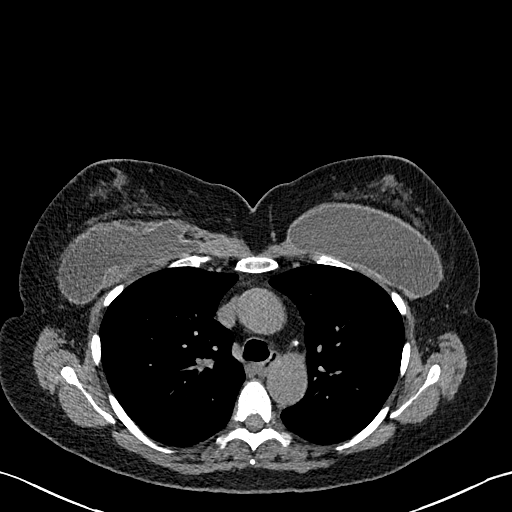
[im 119/172  lung]
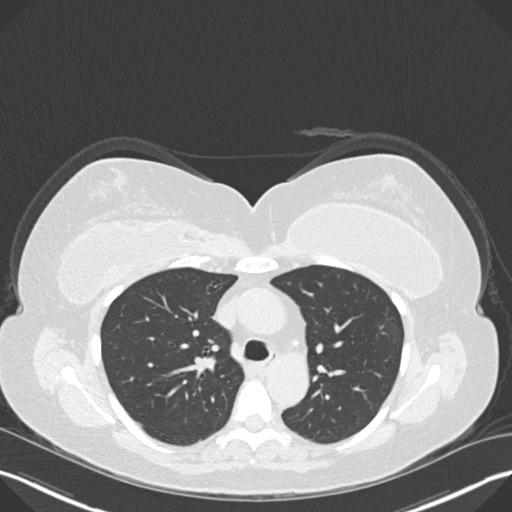
[im 132/172  lung]
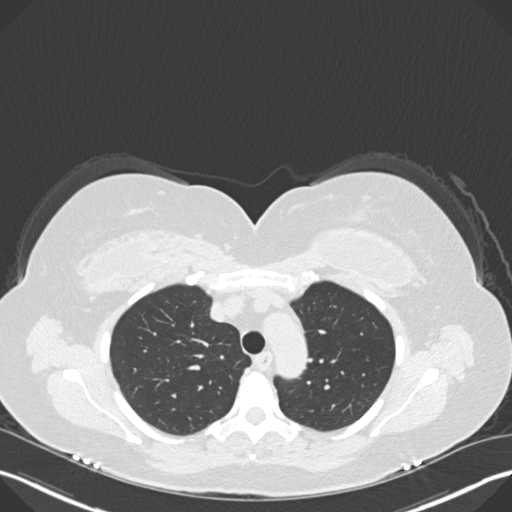
[im 145/172  lung]
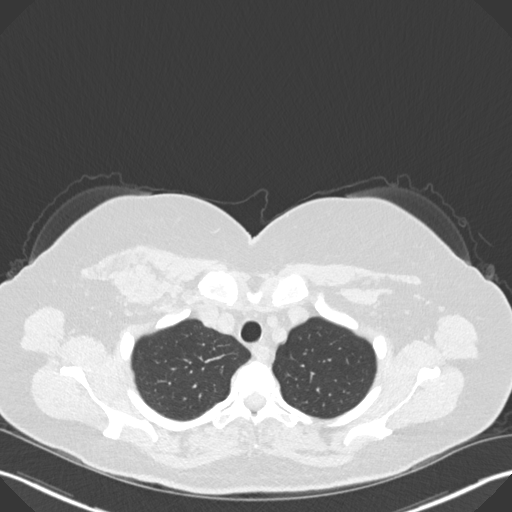
[im 158/172  lung]
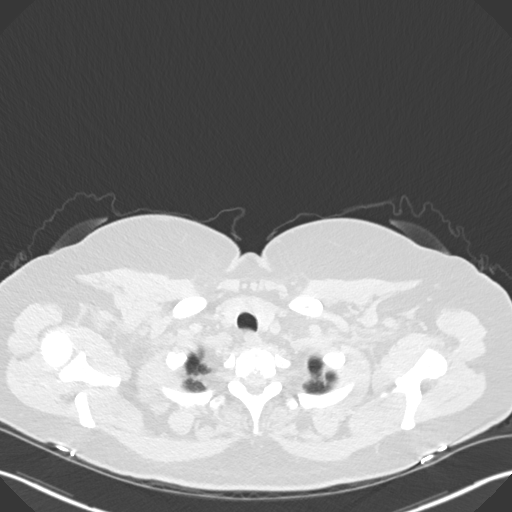

[Series 5: coronal · coronal · 0.64mm/px · 3 of 127 slices shown]
[im 26/127  lung]
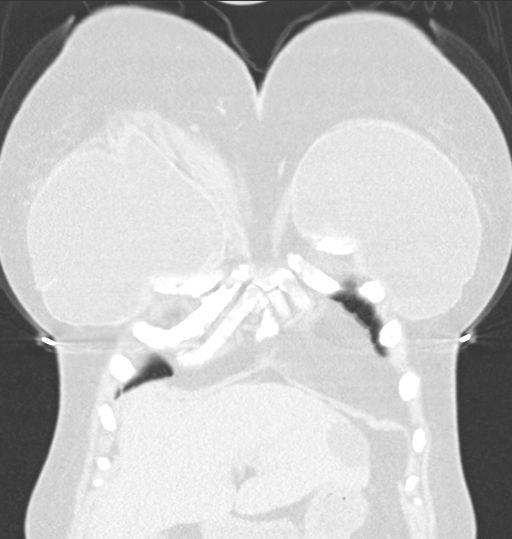
[im 51/127  lung]
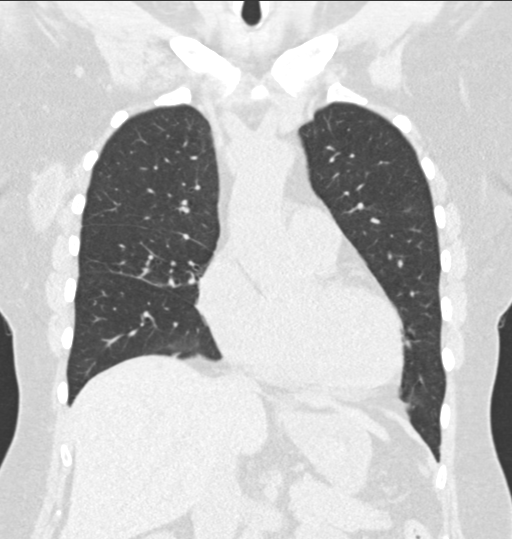
[im 76/127  lung]
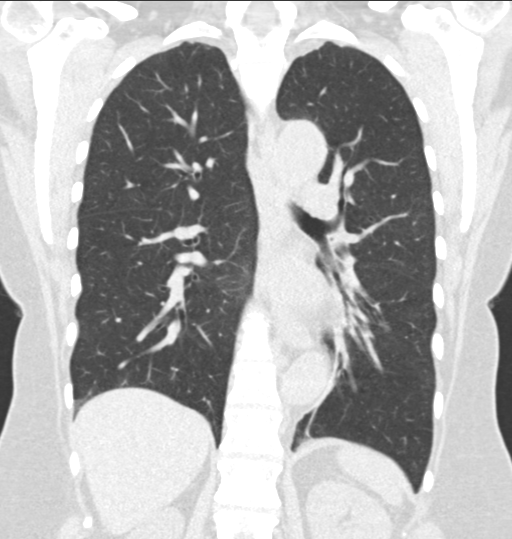

[15 of 36 positions shown; findings below may reference images not displayed]

FINDINGS: Cardiovascular: No significant vascular findings. Normal heart size.
No pericardial effusion.

Mediastinum/Nodes: 1.5 cm left thyroid nodule is noted. Esophagus is
unremarkable. No adenopathy is noted.

Lungs/Pleura: Lungs are clear. No pleural effusion or pneumothorax.

Upper Abdomen: Multiple probable hepatic cysts are noted.

Musculoskeletal: No chest wall mass or suspicious bone lesions
identified.
IMPRESSION: 1. 1.5 cm left thyroid nodule is noted. Recommend thyroid US. (Ref:
[HOSPITAL]. [DATE]): 143-50).
2. Multiple probable hepatic cysts are noted.
3. No other significant abnormality seen in the chest.

## 2021-11-04 ENCOUNTER — Telehealth: Payer: Self-pay | Admitting: *Deleted

## 2021-11-04 NOTE — Telephone Encounter (Signed)
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 Patient states she will be 2 weeks out from surgery on Friday. Patient states she will be restarting Humira on Friday per Dr. Lyndel Safe. Patient wanted to know when she can restart her MTX. Patient advised she will need clearance from her surgeon and he will need to send [place] that clearance letter. Patient expressed understanding.  ?

## 2021-11-06 ENCOUNTER — Telehealth: Payer: Self-pay | Admitting: *Deleted

## 2021-11-06 NOTE — Telephone Encounter (Signed)
Received clearance letter from [facility] giving clearance to resume Humira and MTX. Called patient and advised.  ?

## 2021-11-12 NOTE — Progress Notes (Deleted)
Office Visit Note  Patient: Tammy Carey             Date of Birth: January 29, 1956           MRN: [id number]             PCP: Marco Collie, MD Referring: Marco Collie, MD Visit Date: 11/26/2021 Occupation: '@GUAROCC'$ @  Subjective:  No chief complaint on file.   History of Present Illness: Tammy Carey is a 66 y.o. female ***   Activities of Daily Living:  Patient reports morning stiffness for *** {minute/hour:19697}.   Patient {ACTIONS;DENIES/REPORTS:21021675::"Denies"} nocturnal pain.  Difficulty dressing/grooming: {ACTIONS;DENIES/REPORTS:21021675::"Denies"} Difficulty climbing stairs: {ACTIONS;DENIES/REPORTS:21021675::"Denies"} Difficulty getting out of chair: {ACTIONS;DENIES/REPORTS:21021675::"Denies"} Difficulty using hands for taps, buttons, cutlery, and/or writing: {ACTIONS;DENIES/REPORTS:21021675::"Denies"}  No Rheumatology ROS completed.   PMFS History:  Patient Active Problem List   Diagnosis Date Noted  . Bilateral sacroiliitis ([place]) 03/20/2021  . Primary osteoarthritis of both hands 03/20/2021  . Primary osteoarthritis of both knees 03/20/2021  . Hx of migraines 07/02/2020  . History of asthma 07/02/2020  . History of IBS 07/02/2020  . History of diverticulitis 07/02/2020  . Crohn's disease with complication ([place]) 15/40/0867  . Essential hypertension 07/02/2020  . Episcleritis of left eye 07/02/2020    Past Medical History:  Diagnosis Date  . Anemia   . Asthma   . Crohn's disease ([place])   . Hx of migraine headaches   . Hypertension   . Pulsatile tinnitus     Family History  Problem Relation Age of Onset  . Hypertension Mother   . Hypercholesterolemia Mother   . Asthma Father   . COPD Father   . Prostate cancer Father   . Hypertension Brother   . Hypercholesterolemia Brother   . ADD / ADHD Son   . Rheum arthritis Cousin   . Rheum arthritis Cousin    Past Surgical History:  Procedure Laterality Date  . APPENDECTOMY    . BREAST ENHANCEMENT  SURGERY     Per patient - in the 80's, replaced around 2008  . COLONOSCOPY  11/05/2008   Mild colonic diverticulosis. Otherwise normal colonoscopy to TI. Minimal activity by endoscopic criteria.   Marland Kitchen ESOPHAGOGASTRODUODENOSCOPY  11/01/2008   Normal EGD.   Marland Kitchen LAPAROTOMY     for endometriosis   Social History   Social History Narrative  . Not on file   Immunization History  Administered Date(s) Administered  . Moderna Sars-Covid-2 Vaccination 08/29/2020  . PFIZER(Purple Top)SARS-COV-2 Vaccination 08/23/2019, 09/13/2019     Objective: Vital Signs: There were no vitals taken for this visit.   Physical Exam   Musculoskeletal Exam: ***  CDAI Exam: CDAI Score: -- Patient Global: --; Provider Global: -- Swollen: --; Tender: -- Joint Exam 11/26/2021   No joint exam has been documented for this visit   There is currently no information documented on the homunculus. Go to the Rheumatology activity and complete the homunculus joint exam.  Investigation: No additional findings.  Imaging: No results found.  Recent Labs: Lab Results  Component Value Date   WBC 7.0 06/16/2021   HGB 12.3 06/16/2021   PLT 450 06/16/2021   NA 139 06/16/2021   K 4.8 06/16/2021   CL 101 06/16/2021   CO2 25 06/16/2021   GLUCOSE 103 (H) 06/16/2021   BUN 12 06/16/2021   CREATININE 0.67 06/16/2021   BILITOT 0.2 06/16/2021   ALKPHOS 58 06/16/2021   AST 15 06/16/2021   ALT 14 06/16/2021   PROT 6.6 06/16/2021   ALBUMIN 4.3  06/16/2021   CALCIUM 9.3 06/16/2021   GFRAA 111 09/04/2020   QFTBGOLDPLUS NEGATIVE 07/02/2020    Speciality Comments: No specialty comments available.  Procedures:  No procedures performed Allergies: Penicillins   Assessment / Plan:     Visit Diagnoses: No diagnosis found.  Orders: No orders of the defined types were placed in this encounter.  No orders of the defined types were placed in this encounter.   Face-to-face time spent with patient was *** minutes.  Greater than 50% of time was spent in counseling and coordination of care.  Follow-Up Instructions: No follow-ups on file.   Earnestine Mealing, CMA  Note - This record has been created using Editor, commissioning.  Chart creation errors have been sought, but may not always  have been located. Such creation errors do not reflect on  the standard of medical care.

## 2021-11-18 ENCOUNTER — Telehealth: Payer: Self-pay | Admitting: Rheumatology

## 2021-11-18 DIAGNOSIS — Z9225 Personal history of immunosupression therapy: Secondary | ICD-10-CM

## 2021-11-18 DIAGNOSIS — Z79899 Other long term (current) drug therapy: Secondary | ICD-10-CM

## 2021-11-18 DIAGNOSIS — Z111 Encounter for screening for respiratory tuberculosis: Secondary | ICD-10-CM

## 2021-11-18 NOTE — Telephone Encounter (Signed)
Patient called the office to cancel her appointment on 4/26 with Dr. Estanislado Pandy due to having to travel because of a death in the family. Patient states she recently had surgery and she isn't mobile enough right now to try and come to an appointment this week. Patient states she is doing well with her Crohns. Patient states she wants to know if Dr. Estanislado Pandy is ok with her pushing her follow up out a few months.  ?

## 2021-11-18 NOTE — Telephone Encounter (Signed)
These have patient have the labs drawn this month.  Her labs are past due.  It is okay to postpone the appointment.

## 2021-11-18 NOTE — Telephone Encounter (Signed)
Patient to have the labs drawn this month.  Her labs are past due.  It is okay to postpone the appointment. Lab orders released for patient and appointment rescheduled.  ? ?

## 2021-11-20 ENCOUNTER — Other Ambulatory Visit: Payer: Self-pay | Admitting: Gastroenterology

## 2021-11-20 DIAGNOSIS — K50919 Crohn's disease, unspecified, with unspecified complications: Secondary | ICD-10-CM

## 2021-11-24 ENCOUNTER — Ambulatory Visit: Payer: BC Managed Care – PPO | Admitting: Rheumatology

## 2021-11-26 ENCOUNTER — Ambulatory Visit: Payer: BC Managed Care – PPO | Admitting: Rheumatology

## 2021-11-26 DIAGNOSIS — E041 Nontoxic single thyroid nodule: Secondary | ICD-10-CM

## 2021-11-26 DIAGNOSIS — K50919 Crohn's disease, unspecified, with unspecified complications: Secondary | ICD-10-CM

## 2021-11-26 DIAGNOSIS — D75839 Thrombocytosis, unspecified: Secondary | ICD-10-CM

## 2021-11-26 DIAGNOSIS — U071 COVID-19: Secondary | ICD-10-CM

## 2021-11-26 DIAGNOSIS — G8929 Other chronic pain: Secondary | ICD-10-CM

## 2021-11-26 DIAGNOSIS — Z8709 Personal history of other diseases of the respiratory system: Secondary | ICD-10-CM

## 2021-11-26 DIAGNOSIS — R5383 Other fatigue: Secondary | ICD-10-CM

## 2021-11-26 DIAGNOSIS — H15102 Unspecified episcleritis, left eye: Secondary | ICD-10-CM

## 2021-11-26 DIAGNOSIS — M1990 Unspecified osteoarthritis, unspecified site: Secondary | ICD-10-CM

## 2021-11-26 DIAGNOSIS — M170 Bilateral primary osteoarthritis of knee: Secondary | ICD-10-CM

## 2021-11-26 DIAGNOSIS — Z8669 Personal history of other diseases of the nervous system and sense organs: Secondary | ICD-10-CM

## 2021-11-26 DIAGNOSIS — Z79899 Other long term (current) drug therapy: Secondary | ICD-10-CM

## 2021-11-26 DIAGNOSIS — M461 Sacroiliitis, not elsewhere classified: Secondary | ICD-10-CM

## 2021-11-26 DIAGNOSIS — I10 Essential (primary) hypertension: Secondary | ICD-10-CM

## 2021-11-26 DIAGNOSIS — K7689 Other specified diseases of liver: Secondary | ICD-10-CM

## 2021-11-26 DIAGNOSIS — M19041 Primary osteoarthritis, right hand: Secondary | ICD-10-CM

## 2021-11-26 DIAGNOSIS — Z8719 Personal history of other diseases of the digestive system: Secondary | ICD-10-CM

## 2021-11-28 LAB — CMP14+EGFR
ALT: 10 IU/L (ref 0–32)
AST: 15 IU/L (ref 0–40)
Albumin/Globulin Ratio: 1.8 (ref 1.2–2.2)
Albumin: 4.2 g/dL (ref 3.8–4.8)
Alkaline Phosphatase: 59 IU/L (ref 44–121)
BUN/Creatinine Ratio: 18 (ref 12–28)
BUN: 14 mg/dL (ref 8–27)
Bilirubin Total: 0.2 mg/dL (ref 0.0–1.2)
CO2: 25 mmol/L (ref 20–29)
Calcium: 9.3 mg/dL (ref 8.7–10.3)
Chloride: 101 mmol/L (ref 96–106)
Creatinine, Ser: 0.8 mg/dL (ref 0.57–1.00)
Globulin, Total: 2.3 g/dL (ref 1.5–4.5)
Glucose: 121 mg/dL — ABNORMAL HIGH (ref 70–99)
Potassium: 4.3 mmol/L (ref 3.5–5.2)
Sodium: 141 mmol/L (ref 134–144)
Total Protein: 6.5 g/dL (ref 6.0–8.5)
eGFR: 82 mL/min/{1.73_m2} (ref >59)

## 2021-11-28 LAB — QUANTIFERON-TB GOLD PLUS
QuantiFERON Mitogen Value: >10 IU/mL
QuantiFERON Nil Value: 0.03 IU/mL
QuantiFERON TB1 Ag Value: 0.03 IU/mL
QuantiFERON TB2 Ag Value: 0.04 IU/mL
QuantiFERON-TB Gold Plus: NEGATIVE

## 2021-11-28 LAB — CBC WITH DIFFERENTIAL/PLATELET
Basophils Absolute: 0.1 10*3/uL (ref 0.0–0.2)
Basos: 1 %
EOS (ABSOLUTE): 0.1 10*3/uL (ref 0.0–0.4)
Eos: 1 %
Hematocrit: 36 % (ref 34.0–46.6)
Hemoglobin: 12.1 g/dL (ref 11.1–15.9)
Immature Grans (Abs): 0 10*3/uL (ref 0.0–0.1)
Immature Granulocytes: 0 %
Lymphocytes Absolute: 1.9 10*3/uL (ref 0.7–3.1)
Lymphs: 26 %
MCH: 31.1 pg (ref 26.6–33.0)
MCHC: 33.6 g/dL (ref 31.5–35.7)
MCV: 93 fL (ref 79–97)
Monocytes Absolute: 0.6 10*3/uL (ref 0.1–0.9)
Monocytes: 8 %
Neutrophils Absolute: 4.7 10*3/uL (ref 1.4–7.0)
Neutrophils: 64 %
Platelets: 477 10*3/uL — ABNORMAL HIGH (ref 150–450)
RBC: 3.89 x10E6/uL (ref 3.77–5.28)
RDW: 14.3 % (ref 11.7–15.4)
WBC: 7.3 10*3/uL (ref 3.4–10.8)

## 2021-11-28 NOTE — Progress Notes (Signed)
CBC and CMP are stable.  TB Gold is negative.

## 2022-01-12 ENCOUNTER — Telehealth: Payer: Self-pay | Admitting: Gastroenterology

## 2022-01-12 NOTE — Telephone Encounter (Signed)
Inbound call from patient requesting a call back to discuss getting a medication refill for vancomycin? Patient stated that she is currently going out of town and is needing the medication. Please advise.

## 2022-01-12 NOTE — Telephone Encounter (Signed)
Dr Lyndel Safe are you willing to prescribe vancomycin for this patient?

## 2022-01-12 NOTE — Telephone Encounter (Signed)
Doxycycline 100 mg p.o. twice daily x 14 days (for Acute diverticulitis) Can you call it in at [place]

## 2022-01-12 NOTE — Telephone Encounter (Signed)
Called in medication. No refills given 28 tablets total.

## 2022-01-16 NOTE — Progress Notes (Unsigned)
Office Visit Note  Patient: Tammy Carey             Date of Birth: 1955/12/23           MRN: [id number]             PCP: Marco Collie, MD Referring: Marco Collie, MD Visit Date: 01/26/2022 Occupation: '@GUAROCC'$ @  Subjective:  No chief complaint on file.   History of Present Illness: Tammy Carey is a 66 y.o. female ***   Activities of Daily Living:  Patient reports morning stiffness for *** {minute/hour:19697}.   Patient {ACTIONS;DENIES/REPORTS:21021675::"Denies"} nocturnal pain.  Difficulty dressing/grooming: {ACTIONS;DENIES/REPORTS:21021675::"Denies"} Difficulty climbing stairs: {ACTIONS;DENIES/REPORTS:21021675::"Denies"} Difficulty getting out of chair: {ACTIONS;DENIES/REPORTS:21021675::"Denies"} Difficulty using hands for taps, buttons, cutlery, and/or writing: {ACTIONS;DENIES/REPORTS:21021675::"Denies"}  No Rheumatology ROS completed.   PMFS History:  Patient Active Problem List   Diagnosis Date Noted  . Bilateral sacroiliitis ([place]) 03/20/2021  . Primary osteoarthritis of both hands 03/20/2021  . Primary osteoarthritis of both knees 03/20/2021  . Hx of migraines 07/02/2020  . History of asthma 07/02/2020  . History of IBS 07/02/2020  . History of diverticulitis 07/02/2020  . Crohn's disease with complication ([place]) 40/34/7425  . Essential hypertension 07/02/2020  . Episcleritis of left eye 07/02/2020    Past Medical History:  Diagnosis Date  . Anemia   . Asthma   . Crohn's disease ([place])   . Hx of migraine headaches   . Hypertension   . Pulsatile tinnitus     Family History  Problem Relation Age of Onset  . Hypertension Mother   . Hypercholesterolemia Mother   . Asthma Father   . COPD Father   . Prostate cancer Father   . Hypertension Brother   . Hypercholesterolemia Brother   . ADD / ADHD Son   . Rheum arthritis Cousin   . Rheum arthritis Cousin    Past Surgical History:  Procedure Laterality Date  . APPENDECTOMY    . BREAST ENHANCEMENT  SURGERY     Per patient - in the 80's, replaced around 2008  . COLONOSCOPY  11/05/2008   Mild colonic diverticulosis. Otherwise normal colonoscopy to TI. Minimal activity by endoscopic criteria.   Marland Kitchen ESOPHAGOGASTRODUODENOSCOPY  11/01/2008   Normal EGD.   Marland Kitchen LAPAROTOMY     for endometriosis   Social History   Social History Narrative  . Not on file   Immunization History  Administered Date(s) Administered  . Moderna Sars-Covid-2 Vaccination 08/29/2020  . PFIZER(Purple Top)SARS-COV-2 Vaccination 08/23/2019, 09/13/2019     Objective: Vital Signs: There were no vitals taken for this visit.   Physical Exam   Musculoskeletal Exam: ***  CDAI Exam: CDAI Score: -- Patient Global: --; Provider Global: -- Swollen: --; Tender: -- Joint Exam 01/26/2022   No joint exam has been documented for this visit   There is currently no information documented on the homunculus. Go to the Rheumatology activity and complete the homunculus joint exam.  Investigation: No additional findings.  Imaging: No results found.  Recent Labs: Lab Results  Component Value Date   WBC 7.3 11/24/2021   HGB 12.1 11/24/2021   PLT 477 (H) 11/24/2021   NA 141 11/24/2021   K 4.3 11/24/2021   CL 101 11/24/2021   CO2 25 11/24/2021   GLUCOSE 121 (H) 11/24/2021   BUN 14 11/24/2021   CREATININE 0.80 11/24/2021   BILITOT 0.2 11/24/2021   ALKPHOS 59 11/24/2021   AST 15 11/24/2021   ALT 10 11/24/2021   PROT 6.5 11/24/2021   ALBUMIN  4.2 11/24/2021   CALCIUM 9.3 11/24/2021   GFRAA 111 09/04/2020   QFTBGOLDPLUS Negative 11/24/2021    Speciality Comments: No specialty comments available.  Procedures:  No procedures performed Allergies: Penicillins   Assessment / Plan:     Visit Diagnoses: Crohn's disease with complication, unspecified gastrointestinal tract location [facility])  Chronic inflammatory arthritis  High risk medication use  Episcleritis of left eye  Bilateral sacroiliitis (HCC)  Primary  osteoarthritis of both hands  Primary osteoarthritis of both knees  Thrombocytosis  Hepatic cyst  Thyroid nodule  Other fatigue  Hx of migraines  Essential hypertension  History of diverticulitis  History of asthma  History of IBS  Orders: No orders of the defined types were placed in this encounter.  No orders of the defined types were placed in this encounter.   Face-to-face time spent with patient was *** minutes. Greater than 50% of time was spent in counseling and coordination of care.  Follow-Up Instructions: No follow-ups on file.   Ofilia Neas, PA-C  Note - This record has been created using Dragon software.  Chart creation errors have been sought, but may not always  have been located. Such creation errors do not reflect on  the standard of medical care.

## 2022-01-26 ENCOUNTER — Encounter: Payer: Self-pay | Admitting: Rheumatology

## 2022-01-26 ENCOUNTER — Ambulatory Visit: Payer: BC Managed Care – PPO | Admitting: Rheumatology

## 2022-01-26 VITALS — BP 130/84 | HR 79 | Ht 66.5 in | Wt 198.0 lb

## 2022-01-26 DIAGNOSIS — E041 Nontoxic single thyroid nodule: Secondary | ICD-10-CM

## 2022-01-26 DIAGNOSIS — K7689 Other specified diseases of liver: Secondary | ICD-10-CM

## 2022-01-26 DIAGNOSIS — K50919 Crohn's disease, unspecified, with unspecified complications: Secondary | ICD-10-CM | POA: Diagnosis not present

## 2022-01-26 DIAGNOSIS — M1990 Unspecified osteoarthritis, unspecified site: Secondary | ICD-10-CM | POA: Diagnosis not present

## 2022-01-26 DIAGNOSIS — M19042 Primary osteoarthritis, left hand: Secondary | ICD-10-CM

## 2022-01-26 DIAGNOSIS — D75839 Thrombocytosis, unspecified: Secondary | ICD-10-CM

## 2022-01-26 DIAGNOSIS — M461 Sacroiliitis, not elsewhere classified: Secondary | ICD-10-CM

## 2022-01-26 DIAGNOSIS — Z79899 Other long term (current) drug therapy: Secondary | ICD-10-CM

## 2022-01-26 DIAGNOSIS — Z8709 Personal history of other diseases of the respiratory system: Secondary | ICD-10-CM

## 2022-01-26 DIAGNOSIS — H15102 Unspecified episcleritis, left eye: Secondary | ICD-10-CM

## 2022-01-26 DIAGNOSIS — M19041 Primary osteoarthritis, right hand: Secondary | ICD-10-CM

## 2022-01-26 DIAGNOSIS — R5383 Other fatigue: Secondary | ICD-10-CM

## 2022-01-26 DIAGNOSIS — Z8669 Personal history of other diseases of the nervous system and sense organs: Secondary | ICD-10-CM

## 2022-01-26 DIAGNOSIS — I10 Essential (primary) hypertension: Secondary | ICD-10-CM

## 2022-01-26 DIAGNOSIS — Z8719 Personal history of other diseases of the digestive system: Secondary | ICD-10-CM

## 2022-01-26 DIAGNOSIS — M170 Bilateral primary osteoarthritis of knee: Secondary | ICD-10-CM

## 2022-02-04 ENCOUNTER — Other Ambulatory Visit: Payer: Self-pay | Admitting: Rheumatology

## 2022-02-04 NOTE — Telephone Encounter (Signed)
Next Visit: 07/13/2022  Last Visit: 01/26/2022  Last Fill: 04/29/2021  Dx: Crohn's disease with complication, unspecified gastrointestinal tract location   Current Dose per office note on 10/08/5692: folic acid 2 mg po qd.    Okay to refill Folic Acid?

## 2022-02-23 ENCOUNTER — Telehealth: Payer: Self-pay | Admitting: Rheumatology

## 2022-02-23 ENCOUNTER — Other Ambulatory Visit: Payer: Self-pay

## 2022-02-23 DIAGNOSIS — Z79899 Other long term (current) drug therapy: Secondary | ICD-10-CM

## 2022-02-23 NOTE — Telephone Encounter (Signed)
Lab orders released for [facility].  

## 2022-02-23 NOTE — Telephone Encounter (Signed)
Patient called the office requesting lab orders be sent to [facility]. Patient states she is going tomorrow.

## 2022-02-25 ENCOUNTER — Other Ambulatory Visit: Payer: Self-pay | Admitting: *Deleted

## 2022-02-25 DIAGNOSIS — K50919 Crohn's disease, unspecified, with unspecified complications: Secondary | ICD-10-CM

## 2022-02-25 LAB — CMP14+EGFR
ALT: 9 IU/L (ref 0–32)
AST: 16 IU/L (ref 0–40)
Albumin/Globulin Ratio: 1.7 (ref 1.2–2.2)
Albumin: 4.2 g/dL (ref 3.9–4.9)
Alkaline Phosphatase: 66 IU/L (ref 44–121)
BUN/Creatinine Ratio: 16 (ref 12–28)
BUN: 12 mg/dL (ref 8–27)
Bilirubin Total: <0.2 mg/dL (ref 0.0–1.2)
CO2: 25 mmol/L (ref 20–29)
Calcium: 9 mg/dL (ref 8.7–10.3)
Chloride: 100 mmol/L (ref 96–106)
Creatinine, Ser: 0.76 mg/dL (ref 0.57–1.00)
Globulin, Total: 2.5 g/dL (ref 1.5–4.5)
Glucose: 92 mg/dL (ref 70–99)
Potassium: 5 mmol/L (ref 3.5–5.2)
Sodium: 137 mmol/L (ref 134–144)
Total Protein: 6.7 g/dL (ref 6.0–8.5)
eGFR: 87 mL/min/{1.73_m2} (ref >59)

## 2022-02-25 LAB — CBC WITH DIFFERENTIAL/PLATELET
Basophils Absolute: 0 10*3/uL (ref 0.0–0.2)
Basos: 0 %
EOS (ABSOLUTE): 0.1 10*3/uL (ref 0.0–0.4)
Eos: 1 %
Hematocrit: 34.4 % (ref 34.0–46.6)
Hemoglobin: 11.6 g/dL (ref 11.1–15.9)
Immature Grans (Abs): 0 10*3/uL (ref 0.0–0.1)
Immature Granulocytes: 0 %
Lymphocytes Absolute: 2.8 10*3/uL (ref 0.7–3.1)
Lymphs: 31 %
MCH: 30.1 pg (ref 26.6–33.0)
MCHC: 33.7 g/dL (ref 31.5–35.7)
MCV: 89 fL (ref 79–97)
Monocytes Absolute: 0.7 10*3/uL (ref 0.1–0.9)
Monocytes: 8 %
Neutrophils Absolute: 5.3 10*3/uL (ref 1.4–7.0)
Neutrophils: 60 %
Platelets: 468 10*3/uL — ABNORMAL HIGH (ref 150–450)
RBC: 3.86 x10E6/uL (ref 3.77–5.28)
RDW: 14.3 % (ref 11.7–15.4)
WBC: 9 10*3/uL (ref 3.4–10.8)

## 2022-02-25 MED ORDER — METHOTREXATE SODIUM CHEMO INJECTION (PF) 50 MG/2ML: INTRAMUSCULAR | 2 refills | Status: DC

## 2022-02-25 NOTE — Progress Notes (Signed)
CBC and CMP are normal.

## 2022-02-25 NOTE — Telephone Encounter (Signed)
Next Visit: 07/13/2022  Last Visit: 01/26/2022  Last Fill: 08/18/2021  DX:  Crohn's disease with complication, unspecified gastrointestinal tract location  Current Dose per office note 01/26/2022:  methotrexate 0.8 mL subcu injections once weekly  Labs: 02/24/2022, CBC and CMP are normal.  Okay to refill MTX?

## 2022-03-16 ENCOUNTER — Other Ambulatory Visit: Payer: Self-pay | Admitting: Gastroenterology

## 2022-03-16 DIAGNOSIS — K50919 Crohn's disease, unspecified, with unspecified complications: Secondary | ICD-10-CM

## 2022-04-03 ENCOUNTER — Other Ambulatory Visit (HOSPITAL_COMMUNITY): Payer: Self-pay

## 2022-04-07 ENCOUNTER — Telehealth: Payer: Self-pay | Admitting: Gastroenterology

## 2022-04-07 NOTE — Telephone Encounter (Signed)
Pt switched insurance to medicare. She needs a pre [place] for [employer]. Please advise, thank you.

## 2022-04-07 NOTE — Telephone Encounter (Signed)
Can we have an auth for her Humira please?

## 2022-04-08 ENCOUNTER — Other Ambulatory Visit (HOSPITAL_COMMUNITY): Payer: Self-pay

## 2022-04-10 ENCOUNTER — Telehealth: Payer: Self-pay | Admitting: Pharmacy Technician

## 2022-04-10 ENCOUNTER — Other Ambulatory Visit (HOSPITAL_COMMUNITY): Payer: Self-pay

## 2022-04-10 NOTE — Telephone Encounter (Signed)
PA on file is active until 12.31.23. Last filled on 8.30.23. Next earliest fill is 9.20.23 per the insurance

## 2022-04-10 NOTE — Telephone Encounter (Signed)
Patient Advocate Encounter  Received notification that prior authorization for HUMIRA is required.   PA submitted on 9.8.23 Key [id number] PA not needed   Luciano Cutter, CPhT Patient Advocate Phone: [phone]

## 2022-04-10 NOTE — Telephone Encounter (Signed)
Left message for pt to call back  °

## 2022-04-13 NOTE — Telephone Encounter (Signed)
Spoke with pt, documented in different phone note:

## 2022-04-13 NOTE — Telephone Encounter (Signed)
Spoke with pt in regard to Humira Patient Assess Support document that our office received by fax: Pt stated that this is something that she initiated and stated that she filled out the first part and sent to them and requesting that we fill out our part. Document was filled out and Faxed to [phone]:  Pt provided application number [id number]  Pt made aware Pt verbalized understanding with all questions answered.

## 2022-04-22 ENCOUNTER — Telehealth: Payer: Self-pay

## 2022-04-22 NOTE — Telephone Encounter (Signed)
Received Faxed document from [employer] [place] stating that they are unable to offer patient assistance with HUMIRA: Pt stated that she was already aware: Pt verbalized understanding with all questions answered.

## 2022-04-24 ENCOUNTER — Telehealth: Payer: Self-pay | Admitting: Gastroenterology

## 2022-04-24 NOTE — Telephone Encounter (Signed)
Pt made stated that she recently went to her PCP Dr Marco Collie and they did labs on her and her CRP was elevated: Pt having Left side abdominal pain: PCP is treating with Doxycycline and Prednisone: Pt states that she is not having any diarrhea or fever: Pt is requesting if she should get a CT scan to Validate what is the cause: Pt states that she feels that she is experiencing these bout of diverticulitis every three months now:  Pt was notified that Dr. Lyndel Safe is out of the office this week but he will be back next week Please advise:

## 2022-04-24 NOTE — Telephone Encounter (Signed)
PT has Crohns and recently her physician Dr. Nyra Capes is thinking she has diverticulitis and wants to know ways for relief as well as what she should do next. Please advise.

## 2022-04-27 NOTE — Telephone Encounter (Signed)
Patient called wanting to follow up with you since she has not heard from Dr. Lyndel Safe.

## 2022-04-28 NOTE — Telephone Encounter (Signed)
She feels much better. Hence we will hold off on CT. Would finish course of Vibramycin. She has all her contact numbers. She will call us if she has problems. RG

## 2022-06-30 NOTE — Progress Notes (Signed)
Office Visit Note  Patient: Tammy Carey             Date of Birth: 01/08/1956           MRN: [id number]             PCP: Marco Collie, MD Referring: Marco Collie, MD Visit Date: 07/13/2022 Occupation: _0 @  Subjective:  Left knee pain  History of Present Illness: Tammy Carey is a 66 y.o. female with history of Crohn's disease, inflammatory arthritis, episcleritis and sacroiliitis.  She has been taking Humira injections 40 mg subcu every other week and methotrexate 0.8 mL subcutaneous weekly with folic acid without any interruption.  She states recently she has been having pain and discomfort in her left knee joint.  She was seen by Dr. Samule Dry did MRI of her left knee joint.  According to her she will see orthopedic surgeon today and will have results discussed with him.  She also had recent BMD which was within normal limits.  No flares of episcleritis or sacroiliitis.  She denies any joint swelling.  According to her she has not had a flare of Crohn's disease.  Activities of Daily Living:  Patient reports morning stiffness for all day in knees. Patient Denies nocturnal pain.  Difficulty dressing/grooming: Denies Difficulty climbing stairs: Reports Difficulty getting out of chair: Reports Difficulty using hands for taps, buttons, cutlery, and/or writing: Denies  Review of Systems  Constitutional:  Negative for fatigue.  HENT:  Negative for mouth sores and mouth dryness.   Eyes:  Negative for dryness.  Respiratory:  Negative for shortness of breath.   Cardiovascular:  Positive for palpitations. Negative for chest pain.  Gastrointestinal:  Negative for blood in stool, constipation and diarrhea.  Endocrine: Negative for increased urination.  Genitourinary:  Positive for involuntary urination.  Musculoskeletal:  Positive for joint pain, gait problem, joint pain, joint swelling and morning stiffness. Negative for myalgias, muscle weakness, muscle tenderness and myalgias.   Skin:  Negative for color change, rash, hair loss and sensitivity to sunlight.  Allergic/Immunologic: Negative for susceptible to infections.  Neurological:  Negative for dizziness and headaches.  Hematological:  Negative for swollen glands.  Psychiatric/Behavioral:  Positive for sleep disturbance. Negative for depressed mood. The patient is not nervous/anxious.     PMFS History:  Patient Active Problem List   Diagnosis Date Noted   Bilateral sacroiliitis ([place]) 03/20/2021   Primary osteoarthritis of both hands 03/20/2021   Primary osteoarthritis of both knees 03/20/2021   Crohn's disease of both small and large intestine without complication ([place]) 56/25/6389   Nuclear sclerotic cataract of both eyes 12/03/2020   Hx of migraines 07/02/2020   History of asthma 07/02/2020   History of IBS 07/02/2020   History of diverticulitis 07/02/2020   Crohn's disease with complication ([facility]) 37/34/2876   Essential hypertension 07/02/2020   Episcleritis of left eye 07/02/2020    Past Medical History:  Diagnosis Date   Anemia    Asthma    Crohn's disease ([place])    Hx of migraine headaches    Hypertension    Pulsatile tinnitus     Family History  Problem Relation Age of Onset   Hypertension Mother    Hypercholesterolemia Mother    Asthma Father    COPD Father    Prostate cancer Father    Hypertension Brother    Hypercholesterolemia Brother    ADD / ADHD Son    Rheum arthritis Cousin    Rheum arthritis Cousin  Past Surgical History:  Procedure Laterality Date   APPENDECTOMY     BREAST ENHANCEMENT SURGERY     Per patient - in the 80's, replaced around 2008   COLONOSCOPY  11/05/2008   Mild colonic diverticulosis. Otherwise normal colonoscopy to TI. Minimal activity by endoscopic criteria.    ESOPHAGOGASTRODUODENOSCOPY  11/01/2008   Normal EGD.    LAPAROTOMY     for endometriosis   Social History   Social History Narrative   Not on file   Immunization History  Administered  Date(s) Administered   Moderna Sars-Covid-2 Vaccination 08/29/2020   PFIZER(Purple Top)SARS-COV-2 Vaccination 08/23/2019, 09/13/2019     Objective: Vital Signs: BP (!) 130/90 (BP Location: Left Arm, Patient Position: Sitting, Cuff Size: Normal)   Pulse 73   Resp 17   Ht _0  (1.676 m)   Wt 206 lb 6.4 oz (93.6 kg)   BMI 33.31 kg/m    Physical Exam Vitals and nursing note reviewed.  Constitutional:      Appearance: She is well-developed.  HENT:     Head: Normocephalic and atraumatic.  Eyes:     Conjunctiva/sclera: Conjunctivae normal.  Cardiovascular:     Rate and Rhythm: Normal rate and regular rhythm.     Heart sounds: Normal heart sounds.  Pulmonary:     Effort: Pulmonary effort is normal.     Breath sounds: Normal breath sounds.  Abdominal:     General: Bowel sounds are normal.     Palpations: Abdomen is soft.  Musculoskeletal:     Cervical back: Normal range of motion.  Lymphadenopathy:     Cervical: No cervical adenopathy.  Skin:    General: Skin is warm and dry.     Capillary Refill: Capillary refill takes less than 2 seconds.  Neurological:     Mental Status: She is alert and oriented to person, place, and time.  Psychiatric:        Behavior: Behavior normal.      Musculoskeletal Exam: Cervical, thoracic and lumbar spine were in good range of motion.  She has some discomfort range of motion of her lumbar spine.  Shoulder joints, elbow joints, wrist joints, MCPs PIPs and DIPs.  Good range of motion with no synovitis.  She had good range of motion of bilateral hip joints.  She had warmth on palpation of her right knee joint.  Left knee joint was in good range of motion without any warmth swelling or effusion.  She had discomfort with range of motion of her left knee joint.  There was no tenderness over ankles or MTPs.  CDAI Exam: CDAI Score: -- Patient Global: --; Provider Global: -- Swollen: --; Tender: -- Joint Exam 07/13/2022   No joint exam has been  documented for this visit   There is currently no information documented on the homunculus. Go to the Rheumatology activity and complete the homunculus joint exam.  Investigation: No additional findings.  Imaging: No results found.  Recent Labs: Lab Results  Component Value Date   WBC 7.9 07/09/2022   HGB 11.3 07/09/2022   PLT 444 07/09/2022   NA 147 (H) 07/09/2022   K 4.9 07/09/2022   CL 107 (H) 07/09/2022   CO2 24 07/09/2022   GLUCOSE 94 07/09/2022   BUN 12 07/09/2022   CREATININE 0.69 07/09/2022   BILITOT <0.2 07/09/2022   ALKPHOS 60 07/09/2022   AST 13 07/09/2022   ALT 10 07/09/2022   PROT 6.4 07/09/2022   ALBUMIN 4.0 07/09/2022   CALCIUM 9.2  07/09/2022   GFRAA 111 09/04/2020   QFTBGOLDPLUS Negative 11/24/2021    Speciality Comments: No specialty comments available.  Procedures:  No procedures performed Allergies: Penicillins   Assessment / Plan:     Visit Diagnoses: Crohn's disease with complication, unspecified gastrointestinal tract location [facility]) -denies having a flare of Crohn's disease.  She has been followed by Dr. Lyndel Safe.  She was treated with Lialda in the past and now on Humira by Dr. Lyndel Safe.  Chronic inflammatory arthritis-patient denies any history of joint swelling.  She states she has been doing well on the combination of Humira and methotrexate.  She has had no recent episodes of sacroiliitis.  She has been experiencing discomfort in her left knee joint for which she has been followed by her orthopedic surgeon.  She had MRI of her knee joint to evaluate this further.  High risk medication use - Humira 40 mg subcu every 14 days, methotrexate 0.8 mL subcu injections once weekly, folic acid 2 mg po qd. labs obtained on July 09, 2022 CBC and CMP were normal.  TB Gold was negative on November 24, 2021.  Patient would like to reduce the dose of methotrexate as she has been asymptomatic almost for 2 years now.  We discussed decreasing the dose of methotrexate.   She will be decreasing the dose of methotrexate to 0.7 mL subcu weekly for next 3 months and then if tolerated she will decrease the dose of methotrexate to 0.6 mL subcu weekly.  Will obtain TB Gold with the next labs in March.  She was advised to get labs every 3 months to monitor for drug toxicity.  TB gold will be checked annually.  Information on immunization was placed in the AVS.  She was advised to hold Humira and methotrexate if she develops an infection and resume after the infection resolves.  Use of sunscreen was advised.  An annual skin examination by dermatologist was advised to screen for skin cancer while she is on Humira.  Episcleritis of left eye - Episcleritis every 8 weeks since 2020.Last episode 01/2020.  She stays asymptomatic.  She is followed by Dr. Manuella Ghazi.  Bilateral sacroiliitis ([facility])-she denies any SI joint discomfort and had no tenderness on the examination.  Primary osteoarthritis of both hands-she has bilateral PIP and DIP thickening.  No synovitis was noted.  Primary osteoarthritis of both knees-she continues to have pain and discomfort in her bilateral knee joints.  She had surgery on her right knee joint in the past and her right knee joint is states warm.  The left knee joint has been causing more trouble recently.  She has an appointment with the orthopedic surgeon this afternoon to discuss the results of MRI.  Thrombocytosis-improved.  History of diverticulitis - patient had 2 episodes of diverticulitis since March 2023.  She has been followed by Dr. Lyndel Safe.  Other medical problems are listed as follows:  Hepatic cyst  History of IBS  History of asthma  Essential hypertension-her blood pressure was elevated today.  She was advised to monitor blood pressure closely and follow-up with her PCP.  Thyroid nodule  Other fatigue-she continues to have some fatigue.  Hx of migraines  Osteoporosis screening -she had a recent DEXA scan which she brought the report  with her today.  DEXA hip scan report was reviewed.  Left femoral neck T-score - 0.9 BMD 0.919 on February 17, 2022.  Use of calcium rich diet, vitamin D and exercise was emphasized.  Orders: Orders Placed This  Encounter  Procedures   CBC with Differential/Platelet   CMP14+EGFR   QuantiFERON-TB Gold Plus   Ambulatory referral to Dermatology   No orders of the defined types were placed in this encounter.    Follow-Up Instructions: Return for Osteoarthritis, Crohn's.   Bo Merino, MD  Note - This record has been created using Editor, commissioning.  Chart creation errors have been sought, but may not always  have been located. Such creation errors do not reflect on  the standard of medical care.

## 2022-07-07 ENCOUNTER — Other Ambulatory Visit: Payer: Self-pay

## 2022-07-07 ENCOUNTER — Telehealth: Payer: Self-pay | Admitting: Rheumatology

## 2022-07-07 DIAGNOSIS — M25462 Effusion, left knee: Secondary | ICD-10-CM | POA: Diagnosis not present

## 2022-07-07 DIAGNOSIS — Z79899 Other long term (current) drug therapy: Secondary | ICD-10-CM

## 2022-07-07 DIAGNOSIS — M25562 Pain in left knee: Secondary | ICD-10-CM | POA: Diagnosis not present

## 2022-07-07 NOTE — Telephone Encounter (Signed)
Lab orders have been released. I called patient and advised.

## 2022-07-07 NOTE — Telephone Encounter (Signed)
Patient called requesting lab orders to be sent to [facility].  Patient plans to go on Thursday, 07/09/22.

## 2022-07-08 DIAGNOSIS — M23222 Derangement of posterior horn of medial meniscus due to old tear or injury, left knee: Secondary | ICD-10-CM | POA: Diagnosis not present

## 2022-07-08 DIAGNOSIS — M1712 Unilateral primary osteoarthritis, left knee: Secondary | ICD-10-CM | POA: Diagnosis not present

## 2022-07-08 DIAGNOSIS — R600 Localized edema: Secondary | ICD-10-CM | POA: Diagnosis not present

## 2022-07-09 DIAGNOSIS — Z79899 Other long term (current) drug therapy: Secondary | ICD-10-CM | POA: Diagnosis not present

## 2022-07-10 LAB — CBC WITH DIFFERENTIAL/PLATELET
Basophils Absolute: 0.1 10*3/uL (ref 0.0–0.2)
Basos: 1 %
EOS (ABSOLUTE): 0.1 10*3/uL (ref 0.0–0.4)
Eos: 1 %
Hematocrit: 35.2 % (ref 34.0–46.6)
Hemoglobin: 11.3 g/dL (ref 11.1–15.9)
Immature Grans (Abs): 0 10*3/uL (ref 0.0–0.1)
Immature Granulocytes: 0 %
Lymphocytes Absolute: 2.9 10*3/uL (ref 0.7–3.1)
Lymphs: 37 %
MCH: 29.5 pg (ref 26.6–33.0)
MCHC: 32.1 g/dL (ref 31.5–35.7)
MCV: 92 fL (ref 79–97)
Monocytes Absolute: 0.6 10*3/uL (ref 0.1–0.9)
Monocytes: 7 %
Neutrophils Absolute: 4.3 10*3/uL (ref 1.4–7.0)
Neutrophils: 54 %
Platelets: 444 10*3/uL (ref 150–450)
RBC: 3.83 x10E6/uL (ref 3.77–5.28)
RDW: 14.5 % (ref 11.7–15.4)
WBC: 7.9 10*3/uL (ref 3.4–10.8)

## 2022-07-10 LAB — CMP14+EGFR
ALT: 10 IU/L (ref 0–32)
AST: 13 IU/L (ref 0–40)
Albumin/Globulin Ratio: 1.7 (ref 1.2–2.2)
Albumin: 4 g/dL (ref 3.9–4.9)
Alkaline Phosphatase: 60 IU/L (ref 44–121)
BUN/Creatinine Ratio: 17 (ref 12–28)
BUN: 12 mg/dL (ref 8–27)
Bilirubin Total: <0.2 mg/dL (ref 0.0–1.2)
CO2: 24 mmol/L (ref 20–29)
Calcium: 9.2 mg/dL (ref 8.7–10.3)
Chloride: 107 mmol/L — ABNORMAL HIGH (ref 96–106)
Creatinine, Ser: 0.69 mg/dL (ref 0.57–1.00)
Globulin, Total: 2.4 g/dL (ref 1.5–4.5)
Glucose: 94 mg/dL (ref 70–99)
Potassium: 4.9 mmol/L (ref 3.5–5.2)
Sodium: 147 mmol/L — ABNORMAL HIGH (ref 134–144)
Total Protein: 6.4 g/dL (ref 6.0–8.5)
eGFR: 96 mL/min/{1.73_m2} (ref >59)

## 2022-07-10 NOTE — Progress Notes (Signed)
CBC is normal.  CMP shows elevated sodium and chloride.  Patient should increase water intake.  Patient should discuss increase sodium level with her PCP.  Please forward results to her PCP.

## 2022-07-13 ENCOUNTER — Ambulatory Visit: Payer: Medicare PPO | Attending: Rheumatology | Admitting: Rheumatology

## 2022-07-13 ENCOUNTER — Encounter: Payer: Self-pay | Admitting: Rheumatology

## 2022-07-13 VITALS — BP 130/90 | HR 73 | Resp 17 | Ht 66.0 in | Wt 206.4 lb

## 2022-07-13 DIAGNOSIS — M1990 Unspecified osteoarthritis, unspecified site: Secondary | ICD-10-CM

## 2022-07-13 DIAGNOSIS — M25462 Effusion, left knee: Secondary | ICD-10-CM | POA: Diagnosis not present

## 2022-07-13 DIAGNOSIS — M461 Sacroiliitis, not elsewhere classified: Secondary | ICD-10-CM | POA: Diagnosis not present

## 2022-07-13 DIAGNOSIS — M19042 Primary osteoarthritis, left hand: Secondary | ICD-10-CM

## 2022-07-13 DIAGNOSIS — Z8719 Personal history of other diseases of the digestive system: Secondary | ICD-10-CM

## 2022-07-13 DIAGNOSIS — M19041 Primary osteoarthritis, right hand: Secondary | ICD-10-CM | POA: Diagnosis not present

## 2022-07-13 DIAGNOSIS — M25562 Pain in left knee: Secondary | ICD-10-CM | POA: Diagnosis not present

## 2022-07-13 DIAGNOSIS — K50919 Crohn's disease, unspecified, with unspecified complications: Secondary | ICD-10-CM | POA: Diagnosis not present

## 2022-07-13 DIAGNOSIS — Z8709 Personal history of other diseases of the respiratory system: Secondary | ICD-10-CM

## 2022-07-13 DIAGNOSIS — Z13820 Encounter for screening for osteoporosis: Secondary | ICD-10-CM

## 2022-07-13 DIAGNOSIS — R5383 Other fatigue: Secondary | ICD-10-CM

## 2022-07-13 DIAGNOSIS — M1712 Unilateral primary osteoarthritis, left knee: Secondary | ICD-10-CM | POA: Diagnosis not present

## 2022-07-13 DIAGNOSIS — Z79899 Other long term (current) drug therapy: Secondary | ICD-10-CM

## 2022-07-13 DIAGNOSIS — H15102 Unspecified episcleritis, left eye: Secondary | ICD-10-CM | POA: Diagnosis not present

## 2022-07-13 DIAGNOSIS — K7689 Other specified diseases of liver: Secondary | ICD-10-CM

## 2022-07-13 DIAGNOSIS — E041 Nontoxic single thyroid nodule: Secondary | ICD-10-CM

## 2022-07-13 DIAGNOSIS — M170 Bilateral primary osteoarthritis of knee: Secondary | ICD-10-CM

## 2022-07-13 DIAGNOSIS — D75839 Thrombocytosis, unspecified: Secondary | ICD-10-CM

## 2022-07-13 DIAGNOSIS — I10 Essential (primary) hypertension: Secondary | ICD-10-CM

## 2022-07-13 DIAGNOSIS — Z8669 Personal history of other diseases of the nervous system and sense organs: Secondary | ICD-10-CM

## 2022-07-13 NOTE — Patient Instructions (Addendum)
  Please decrease the dose of methotrexate 0.7 mL weekly subcutaneous for the next 3 months and then may decrease the dose to 0.6 mL weekly if tolerated. Standing Labs We placed an order today for your standing lab work.   Please have your standing labs drawn in March and every 3 months  TB Gold with next  Please have your labs drawn 2 weeks prior to your appointment so that the provider can discuss your lab results at your appointment.  Please note that you may see your imaging and lab results in [facility] before we have reviewed them. We will contact you once all results are reviewed. Please allow our office up to 72 hours to thoroughly review all of the results before contacting the office for clarification of your results.  Lab hours are:   Monday through Thursday from 8:00 am -12:30 pm and 1:00 pm-5:00 pm and Friday from 8:00 am-12:00 pm.  Please be advised, all patients with office appointments requiring lab work will take precedent over walk-in lab work.   Labs are drawn by [facility]. Please bring your co-pay at the time of your lab draw.  You may receive a bill from [facility] for your lab work.  Please note if you are on Hydroxychloroquine and and an order has been placed for a Hydroxychloroquine level, you will need to have it drawn 4 hours or more after your last dose.  If you wish to have your labs drawn at another location, please call the office 24 hours in advance so we can fax the orders.  The office is located at [address] [place], [place], [place] [address] No appointment is necessary.    If you have any questions regarding directions or hours of operation,  please call [phone].   As a reminder, please drink plenty of water prior to coming for your lab work. Thanks!   Vaccines You are taking a medication(s) that can suppress your immune system.  The following immunizations are recommended: Flu annually Covid-19  RSV Td/Tdap (tetanus, diphtheria, pertussis)  every 10 years Pneumonia (Prevnar 15 then Pneumovax 23 at least 1 year apart.  Alternatively, can take Prevnar 20 without needing additional dose) Shingrix: 2 doses from 4 weeks to 6 months apart  Please check with your PCP to make sure you are up to date.   If you have signs or symptoms of an infection or start antibiotics: First, call your PCP for workup of your infection. Hold your medication through the infection, until you complete your antibiotics, and until symptoms resolve if you take the following: Injectable medication (Actemra, Benlysta, Cimzia, Cosentyx, Enbrel, Humira, Kevzara, Orencia, Remicade, Simponi, Stelara, Taltz, Tremfya) Methotrexate Leflunomide (Arava) Mycophenolate (Cellcept) Morrie Sheldon, Olumiant, or Rinvoq  Please get an annual skin exam to screen for skin cancer while you are on Humira.

## 2022-07-23 ENCOUNTER — Other Ambulatory Visit: Payer: Self-pay | Admitting: Gastroenterology

## 2022-07-23 DIAGNOSIS — K50919 Crohn's disease, unspecified, with unspecified complications: Secondary | ICD-10-CM

## 2022-07-28 DIAGNOSIS — Z880 Allergy status to penicillin: Secondary | ICD-10-CM | POA: Diagnosis not present

## 2022-07-28 DIAGNOSIS — E669 Obesity, unspecified: Secondary | ICD-10-CM | POA: Diagnosis not present

## 2022-07-28 DIAGNOSIS — K219 Gastro-esophageal reflux disease without esophagitis: Secondary | ICD-10-CM | POA: Diagnosis not present

## 2022-07-28 DIAGNOSIS — Z809 Family history of malignant neoplasm, unspecified: Secondary | ICD-10-CM | POA: Diagnosis not present

## 2022-07-28 DIAGNOSIS — F411 Generalized anxiety disorder: Secondary | ICD-10-CM | POA: Diagnosis not present

## 2022-07-28 DIAGNOSIS — Z825 Family history of asthma and other chronic lower respiratory diseases: Secondary | ICD-10-CM | POA: Diagnosis not present

## 2022-07-28 DIAGNOSIS — Z8249 Family history of ischemic heart disease and other diseases of the circulatory system: Secondary | ICD-10-CM | POA: Diagnosis not present

## 2022-07-28 DIAGNOSIS — K5090 Crohn's disease, unspecified, without complications: Secondary | ICD-10-CM | POA: Diagnosis not present

## 2022-07-28 DIAGNOSIS — E785 Hyperlipidemia, unspecified: Secondary | ICD-10-CM | POA: Diagnosis not present

## 2022-08-07 ENCOUNTER — Encounter: Payer: Self-pay | Admitting: Gastroenterology

## 2022-08-07 ENCOUNTER — Ambulatory Visit: Payer: Medicare PPO | Admitting: Gastroenterology

## 2022-08-07 VITALS — BP 116/86 | HR 68 | Ht 66.0 in | Wt 208.4 lb

## 2022-08-07 DIAGNOSIS — K50919 Crohn's disease, unspecified, with unspecified complications: Secondary | ICD-10-CM | POA: Diagnosis not present

## 2022-08-07 DIAGNOSIS — K219 Gastro-esophageal reflux disease without esophagitis: Secondary | ICD-10-CM | POA: Diagnosis not present

## 2022-08-07 DIAGNOSIS — K580 Irritable bowel syndrome with diarrhea: Secondary | ICD-10-CM | POA: Diagnosis not present

## 2022-08-07 DIAGNOSIS — Z8719 Personal history of other diseases of the digestive system: Secondary | ICD-10-CM | POA: Diagnosis not present

## 2022-08-07 MED ORDER — OMEPRAZOLE 40 MG PO CPDR: 40.0000 mg | DELAYED_RELEASE_CAPSULE | Freq: Every day | ORAL | 3 refills | Status: DC

## 2022-08-07 MED ORDER — DICYCLOMINE HCL 20 MG PO TABS: 20.0000 mg | ORAL_TABLET | Freq: Four times a day (QID) | ORAL | 2 refills | Status: DC | PRN

## 2022-08-07 NOTE — Progress Notes (Signed)
Chief Complaint: FU   Referring Provider: Dr. Marco Collie      ASSESSMENT AND PLAN;   #1. Crohn's disease: Dx 2006, involving R colon. #2. Extraintestinal manifestations- episcleritis, sacroiliitis and inflammatory arthritis. #3. 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 GERD  Plan:  - Colon with miralax. - Continue Humira/Mtx. - Bentyl '20mg'$  po QID prn #120 2RF - Omeprazole '40mg'$  po QD #30, 2RF. Can use PRN.    HPI:    Tammy Carey is a 67 y.o. female  With Crohn's disease associated episcleritis, sacroiliitis, inflammatory arthritis currently on Humira/methotrexate, history of diverticulitis (treated with Doxy)   For follow-up visit.  Doing very well from GI standpoint.  No nausea, vomiting, heartburn, regurgitation, odynophagia or dysphagia.  No significant diarrhea or constipation.  No melena or hematochezia. No unintentional weight loss. No abdominal pain.  Here for colonoscopy.  Methotrexate is gradually being tapered off.    Previous GI work-up: Pertinent labs: From previous notes -TB Gold 07/02/2020: neg -HBsAg -HCV-07/02/2020: neg -Sed rate 06/2020 -TPMT Nl 06/2018  Colonoscopy 05/04/2018 - A few erosions in the ascending colon and in the cecum. Biopsied. - Moderate left colonic diverticulosis. - Non-bleeding internal hemorrhoids. - Nl TI - Path: 1. Surgical [P], terminal ileum - SMALL BOWEL MUCOSA WITH LYMPHOID AGGREGATES - NO ACUTE INFLAMMATION, GRANULOMAS OR MALIGNANCY IDENTIFIED 2. Surgical [P], ascending and cecum - CHRONIC ACTIVE COLITIS WITH ULCER - NO GRANULOMATA, DYSPLASIA OR MALIGNANCY IDENTIFIED 3. Surgical [P], left side - BENIGN COLONIC MUCOSA WITH MINIMAL ARCHITECTURE DISTORTION - NO ACTIVE INFLAMMATION, HIGH GRADE DYSPLASIA OR MALIGNANCY IDENTIFIED  -Colonoscopy ([facility]) 11/05/2008: Mild sigmoid diverticulosis, otherwise normal colon to TI, neg TI and random colonic biopsies. Past  Medical History:  Diagnosis Date   Anemia    Asthma    Crohn's disease ([place])    Hx of migraine headaches    Hypertension    Pulsatile tinnitus     Past Surgical History:  Procedure Laterality Date   APPENDECTOMY     BREAST ENHANCEMENT SURGERY     Per patient - in the 80's, replaced around 2008   COLONOSCOPY  11/05/2008   Mild colonic diverticulosis. Otherwise normal colonoscopy to TI. Minimal activity by endoscopic criteria.    ESOPHAGOGASTRODUODENOSCOPY  11/01/2008   Normal EGD.    KNEE SURGERY Right 10/2021   miniscus repair   LAPAROTOMY     for endometriosis    Family History  Problem Relation Age of Onset   Hypertension Mother    Hypercholesterolemia Mother    Asthma Father    COPD Father    Prostate cancer Father    Hypertension Brother    Hypercholesterolemia Brother    ADD / ADHD Son    Rheum arthritis Cousin    Rheum arthritis Cousin    Colon cancer Neg Hx     Social History  Assoc Agricultural consultant Tobacco Use   Smoking status: Never Smoker   Smokeless tobacco: Never Used  Substance Use Topics   Alcohol use: Yes    Alcohol/week: 3.0 standard drinks    Types: 3 Standard drinks or equivalent per week   Drug use: Never    Current Outpatient Medications  Medication Sig Dispense Refill   AMBULATORY NON FORMULARY MEDICATION as needed. GI cocktail     DULoxetine (CYMBALTA) 30 MG capsule Take 30 mg by mouth daily.     estradiol (ESTRACE) 0.5 MG tablet Take 0.5 mg by mouth daily.  folic acid (FOLVITE) 1 MG tablet TAKE 2 TABLETS BY MOUTH EVERY DAY 180 tablet 2   HUMIRA PEN 40 MG/0.4ML PNKT INJECT 1 PEN UNDER THE SKIN EVERY 14 DAYS. 2 each 3   Methotrexate Sodium (METHOTREXATE, PF,) 50 MG/2ML injection INJECT 0.8 ML INTO THE SKIN ONCE WEEKLY. (Patient taking differently: INJECT 0.7 ML INTO THE SKIN ONCE WEEKLY.) 8 mL 2   Multiple Vitamin (MULTIVITAMIN) tablet Take 1 tablet by mouth daily.     NURTEC 75 MG TBDP Take by mouth daily as needed.      omeprazole (PRILOSEC) 40 MG capsule Take 1 capsule by mouth daily as needed.     progesterone (PROMETRIUM) 100 MG capsule 100 mg daily.      rosuvastatin (CRESTOR) 5 MG tablet Take 5 mg by mouth daily.     Tuberculin-Allergy Syringes 27G X 1/2" 1 ML MISC Use 1 syringe once weekly to inject methotrexate. 12 each 3   Current Facility-Administered Medications  Medication Dose Route Frequency Provider Last Rate Last Admin   albuterol (VENTOLIN HFA) 108 (90 Base) MCG/ACT inhaler 2 puff  2 puff Inhalation Once PRN Causey, Charlestine Massed, NP       diphenhydrAMINE (BENADRYL) injection 50 mg  50 mg Intramuscular Once PRN Causey, Charlestine Massed, NP       EPINEPHrine (EPI-PEN) injection 0.3 mg  0.3 mg Intramuscular Once PRN Gardenia Phlegm, NP       methylPREDNISolone sodium succinate (SOLU-MEDROL) 125 mg/2 mL injection 125 mg  125 mg Intramuscular Once PRN Causey, Charlestine Massed, NP        Allergies  Allergen Reactions   Penicillins Other (See Comments)    Pt. Stated," I just know I am."    Review of Systems:  neg     Physical Exam:    BP 116/86   Pulse 68   Ht '5\' 6"'$  (1.676 m)   Wt 208 lb 6.4 oz (94.5 kg)   SpO2 95%   BMI 33.64 kg/m  Filed Weights   08/07/22 1116  Weight: 208 lb 6.4 oz (94.5 kg)   Constitutional:  Well-developed, in no acute distress. Psychiatric: Normal mood and affect. Behavior is normal. HEENT: Pupils normal.  Conjunctivae are normal. No scleral icterus. Cardiovascular: Normal rate, regular rhythm. No edema Pulmonary/chest: Effort normal and breath sounds normal. No wheezing, rales or rhonchi. Abdominal: Soft, nondistended. LLQ tenderness without rebound.  Bowel sounds active throughout. There are no masses palpable. No hepatomegaly. Rectal:  defered Neurological: Alert and oriented to person place and time. Skin: Skin is warm and dry. No rashes noted.     Latest Ref Rng & Units 07/09/2022    2:28 PM 02/24/2022    3:31 PM 11/24/2021    9:41 AM   CBC  WBC 3.4 - 10.8 x10E3/uL 7.9  9.0  7.3   Hemoglobin 11.1 - 15.9 g/dL 11.3  11.6  12.1   Hematocrit 34.0 - 46.6 % 35.2  34.4  36.0   Platelets 150 - 450 x10E3/uL 444  468  477       Latest Ref Rng & Units 07/09/2022    2:28 PM 02/24/2022    3:31 PM 11/24/2021    9:41 AM  CMP  Glucose 70 - 99 mg/dL 94  92  121   BUN 8 - 27 mg/dL '12  12  14   '$ Creatinine 0.57 - 1.00 mg/dL 0.69  0.76  0.80   Sodium 134 - 144 mmol/L 147  137  141   Potassium  3.5 - 5.2 mmol/L 4.9  5.0  4.3   Chloride 96 - 106 mmol/L 107  100  101   CO2 20 - 29 mmol/L '24  25  25   '$ Calcium 8.7 - 10.3 mg/dL 9.2  9.0  9.3   Total Protein 6.0 - 8.5 g/dL 6.4  6.7  6.5   Total Bilirubin 0.0 - 1.2 mg/dL <0.2  <0.2  0.2   Alkaline Phos 44 - 121 IU/L 60  66  59   AST 0 - 40 IU/L '13  16  15   '$ ALT 0 - 32 IU/L '10  9  10       '$ [name], MD 08/07/2022, 11:28 AM  Cc: Dr. Marco Collie

## 2022-08-07 NOTE — Patient Instructions (Addendum)
_______________________________________________________  If you are age 66 or older, your body mass index should be between 23-30. Your Body mass index is 33.64 kg/m. If this is out of the aforementioned range listed, please consider follow up with your Primary Care Provider.  If you are age 66 or younger, your body mass index should be between 19-25. Your Body mass index is 33.64 kg/m. If this is out of the aformentioned range listed, please consider follow up with your Primary Care Provider.   ________________________________________________________  The [place] [facility] providers would like to encourage you to use [facility] to communicate with providers for non-urgent requests or questions.  Due to long hold times on the telephone, sending your provider a message by [facility] may be a faster and more efficient way to get a response.  Please allow 48 business hours for a response.  Please remember that this is for non-urgent requests.  _______________________________________________________  Tammy Carey have been scheduled for a colonoscopy. Please follow written instructions given to you at your visit today.  Please pick up your prep supplies at the pharmacy within the next 1-3 days. If you use inhalers (even only as needed), please bring them with you on the day of your procedure.  We have sent the following medications to your pharmacy for you to pick up at your convenience: Bentyl Omeprazole  Thank you,  Dr. Jackquline Denmark

## 2022-08-13 ENCOUNTER — Encounter: Payer: Self-pay | Admitting: Gastroenterology

## 2022-08-20 ENCOUNTER — Ambulatory Visit (AMBULATORY_SURGERY_CENTER): Payer: Medicare PPO | Admitting: Gastroenterology

## 2022-08-20 ENCOUNTER — Encounter: Payer: Self-pay | Admitting: Gastroenterology

## 2022-08-20 VITALS — BP 145/78 | HR 62 | Temp 98.6°F | Resp 13 | Ht 66.0 in | Wt 208.0 lb

## 2022-08-20 DIAGNOSIS — J45909 Unspecified asthma, uncomplicated: Secondary | ICD-10-CM | POA: Diagnosis not present

## 2022-08-20 DIAGNOSIS — Z1211 Encounter for screening for malignant neoplasm of colon: Secondary | ICD-10-CM

## 2022-08-20 DIAGNOSIS — K50919 Crohn's disease, unspecified, with unspecified complications: Secondary | ICD-10-CM

## 2022-08-20 DIAGNOSIS — Z0389 Encounter for observation for other suspected diseases and conditions ruled out: Secondary | ICD-10-CM | POA: Diagnosis not present

## 2022-08-20 DIAGNOSIS — I10 Essential (primary) hypertension: Secondary | ICD-10-CM | POA: Diagnosis not present

## 2022-08-20 MED ORDER — SODIUM CHLORIDE 0.9 % IV SOLN: 500.0000 mL | Freq: Once | INTRAVENOUS | Status: DC

## 2022-08-20 NOTE — Op Note (Signed)
[facility] Patient Name: Tammy Carey Procedure Date: 08/20/2022 2:21 PM MRN: [id number] Endoscopist: Jackquline Denmark , MD, [id number] Age: 67 Referring MD:  Date of Birth: August 28, 1955 Gender: Female Account #: [id number] Procedure:                Colonoscopy Indications:              Screening for colorectal malignant neoplasm-Crohn's                            colitis dx 2006 Medicines:                Monitored Anesthesia Care Procedure:                Pre-Anesthesia Assessment:                           - Prior to the procedure, a History and Physical                            was performed, and patient medications and                            allergies were reviewed. The patient's tolerance of                            previous anesthesia was also reviewed. The risks                            and benefits of the procedure and the sedation                            options and risks were discussed with the patient.                            All questions were answered, and informed consent                            was obtained. Prior Anticoagulants: The patient has                            taken no anticoagulant or antiplatelet agents. ASA                            Grade Assessment: II - A patient with mild systemic                            disease. After reviewing the risks and benefits,                            the patient was deemed in satisfactory condition to                            undergo the procedure.  After obtaining informed consent, the colonoscope                            was passed under direct vision. Throughout the                            procedure, the patient's blood pressure, pulse, and                            oxygen saturations were monitored continuously. The                            PCF-HQ190L Colonoscope was introduced through the                            anus and advanced to the 4 cm into the  ileum. The                            colonoscopy was performed without difficulty. The                            patient tolerated the procedure well. The quality                            of the bowel preparation was good. The terminal                            ileum, ileocecal valve, appendiceal orifice, and                            rectum were photographed. Scope In: 2:27:00 PM Scope Out: 2:42:52 PM Scope Withdrawal Time: 0 hours 12 minutes 39 seconds  Total Procedure Duration: 0 hours 15 minutes 52 seconds  Findings:                 The colon (entire examined portion) appeared                            normal. Biopsies for histology were taken with a                            cold forceps from R and L colon for evaluation of                            crohns colitis.                           The terminal ileum appeared normal. Biopsies were                            taken with a cold forceps for histology.                           Multiple medium-mouthed diverticula were found in  the sigmoid colon, descending colon, few in                            transverse colon and ascending colon.                           Non-bleeding internal hemorrhoids were found during                            retroflexion. The hemorrhoids were small and Grade                            I (internal hemorrhoids that do not prolapse).                           The exam was otherwise without abnormality on                            direct and retroflexion views. Complications:            No immediate complications. Estimated Blood Loss:     Estimated blood loss: none. Impression:               - Pancolonic diverticulosis predominantly in the                            sigmoid colon.                           - No endoscopic evidence of Crohn's disease.                           - Non-bleeding internal hemorrhoids.                           - The examination was  otherwise normal on direct                            and retroflexion views. Recommendation:           - Patient has a contact number available for                            emergencies. The signs and symptoms of potential                            delayed complications were discussed with the                            patient. Return to normal activities tomorrow.                            Written discharge instructions were provided to the                            patient.                           -  Resume previous diet.                           - Continue present medications.                           - Await pathology results.                           - Repeat screening colonoscopy in 5 years for                            screening purposes since Crohn's disease is under                            endoscopic remission. Earlier, if with any problems.                           - The findings and recommendations were discussed                            with the patient's family. Jackquline Denmark, MD 08/20/2022 2:50:10 PM This report has been signed electronically.

## 2022-08-20 NOTE — Patient Instructions (Signed)
Handout provided on diverticulosis and hemorrhoids.   Resume previous diet.  Continue present medications.  Await pathology results.  Repeat screening colonoscopy in 5 years for screening purposes since Crohn's disease is under endoscopic remission. Earlier, if with any problems.   YOU HAD AN ENDOSCOPIC PROCEDURE TODAY AT [facility]:   Refer to the procedure report that was given to you for any specific questions about what was found during the examination.  If the procedure report does not answer your questions, please call your gastroenterologist to clarify.  If you requested that your care partner not be given the details of your procedure findings, then the procedure report has been included in a sealed envelope for you to review at your convenience later.  YOU SHOULD EXPECT: Some feelings of bloating in the abdomen. Passage of more gas than usual.  Walking can help get rid of the air that was put into your GI tract during the procedure and reduce the bloating. If you had a lower endoscopy (such as a colonoscopy or flexible sigmoidoscopy) you may notice spotting of blood in your stool or on the toilet paper. If you underwent a bowel prep for your procedure, you may not have a normal bowel movement for a few days.  Please Note:  You might notice some irritation and congestion in your nose or some drainage.  This is from the oxygen used during your procedure.  There is no need for concern and it should clear up in a day or so.  SYMPTOMS TO REPORT IMMEDIATELY:  Following lower endoscopy (colonoscopy or flexible sigmoidoscopy):  Excessive amounts of blood in the stool  Significant tenderness or worsening of abdominal pains  Swelling of the abdomen that is new, acute  Fever of 100F or higher  For urgent or emergent issues, a gastroenterologist can be reached at any hour by calling [phone]. Do not use MyChart messaging for urgent concerns.    DIET:  We do recommend a  small meal at first, but then you may proceed to your regular diet.  Drink plenty of fluids but you should avoid alcoholic beverages for 24 hours.  ACTIVITY:  You should plan to take it easy for the rest of today and you should NOT DRIVE or use heavy machinery until tomorrow (because of the sedation medicines used during the test).    FOLLOW UP: Our staff will call the number listed on your records the next business day following your procedure.  We will call around 7:15- 8:00 am to check on you and address any questions or concerns that you may have regarding the information given to you following your procedure. If we do not reach you, we will leave a message.     If any biopsies were taken you will be contacted by phone or by letter within the next 1-3 weeks.  Please call us at [phone] if you have not heard about the biopsies in 3 weeks.    SIGNATURES/CONFIDENTIALITY: You and/or your care partner have signed paperwork which will be entered into your electronic medical record.  These signatures attest to the fact that that the information above on your After Visit Summary has been reviewed and is understood.  Full responsibility of the confidentiality of this discharge information lies with you and/or your care-partner.

## 2022-08-20 NOTE — Progress Notes (Signed)
VS completed by DT.  Pt's states no medical or surgical changes since previsit or office visit.  

## 2022-08-20 NOTE — Progress Notes (Signed)
Pt with good respiratory effort. Palpable exhalation noted. Intermittent ETCO2 waveform. VSS. Will monitor.

## 2022-08-20 NOTE — Progress Notes (Signed)
Chief Complaint: FU   Referring Provider: Dr. Marco Collie      ASSESSMENT AND PLAN;   #1. Crohn's disease: Dx 2006, involving R colon. #2. Extraintestinal manifestations- episcleritis, sacroiliitis and inflammatory arthritis. #3. 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 GERD  Plan:  - Colon with miralax. - Continue Humira/Mtx. - Bentyl '20mg'$  po QID prn #120 2RF - Omeprazole '40mg'$  po QD #30, 2RF. Can use PRN.    HPI:    Tammy Carey is a 67 y.o. female  With Crohn's disease associated episcleritis, sacroiliitis, inflammatory arthritis currently on Humira/methotrexate, history of diverticulitis (treated with Doxy)   For follow-up visit.  Doing very well from GI standpoint.  No nausea, vomiting, heartburn, regurgitation, odynophagia or dysphagia.  No significant diarrhea or constipation.  No melena or hematochezia. No unintentional weight loss. No abdominal pain.  Here for colonoscopy.  Methotrexate is gradually being tapered off.    Previous GI work-up: Pertinent labs: From previous notes -TB Gold 07/02/2020: neg -HBsAg -HCV-07/02/2020: neg -Sed rate 06/2020 -TPMT Nl 06/2018  Colonoscopy 05/04/2018 - A few erosions in the ascending colon and in the cecum. Biopsied. - Moderate left colonic diverticulosis. - Non-bleeding internal hemorrhoids. - Nl TI - Path: 1. Surgical [P], terminal ileum - SMALL BOWEL MUCOSA WITH LYMPHOID AGGREGATES - NO ACUTE INFLAMMATION, GRANULOMAS OR MALIGNANCY IDENTIFIED 2. Surgical [P], ascending and cecum - CHRONIC ACTIVE COLITIS WITH ULCER - NO GRANULOMATA, DYSPLASIA OR MALIGNANCY IDENTIFIED 3. Surgical [P], left side - BENIGN COLONIC MUCOSA WITH MINIMAL ARCHITECTURE DISTORTION - NO ACTIVE INFLAMMATION, HIGH GRADE DYSPLASIA OR MALIGNANCY IDENTIFIED  -Colonoscopy ([facility]) 11/05/2008: Mild sigmoid diverticulosis, otherwise normal colon to TI, neg TI and random colonic biopsies. Past  Medical History:  Diagnosis Date   Anemia    Asthma    Crohn's disease ([place])    Hx of migraine headaches    Hypertension    Pulsatile tinnitus     Past Surgical History:  Procedure Laterality Date   APPENDECTOMY     BREAST ENHANCEMENT SURGERY     Per patient - in the 80's, replaced around 2008   COLONOSCOPY  11/05/2008   Mild colonic diverticulosis. Otherwise normal colonoscopy to TI. Minimal activity by endoscopic criteria.    ESOPHAGOGASTRODUODENOSCOPY  11/01/2008   Normal EGD.    KNEE SURGERY Right 10/2021   miniscus repair   LAPAROTOMY     for endometriosis    Family History  Problem Relation Age of Onset   Hypertension Mother    Hypercholesterolemia Mother    Asthma Father    COPD Father    Prostate cancer Father    Hypertension Brother    Hypercholesterolemia Brother    ADD / ADHD Son    Rheum arthritis Cousin    Rheum arthritis Cousin    Colon cancer Neg Hx     Social History  Assoc Agricultural consultant Tobacco Use   Smoking status: Never Smoker   Smokeless tobacco: Never Used  Substance Use Topics   Alcohol use: Yes    Alcohol/week: 3.0 standard drinks    Types: 3 Standard drinks or equivalent per week   Drug use: Never    Current Outpatient Medications  Medication Sig Dispense Refill   DULoxetine (CYMBALTA) 30 MG capsule Take 30 mg by mouth daily.     estradiol (ESTRACE) 0.5 MG tablet Take 0.5 mg by mouth daily.     folic acid (FOLVITE) 1 MG tablet TAKE 2 TABLETS  BY MOUTH EVERY DAY 180 tablet 2   Multiple Vitamin (MULTIVITAMIN) tablet Take 1 tablet by mouth daily.     omeprazole (PRILOSEC) 40 MG capsule Take 1 capsule (40 mg total) by mouth daily. 30 capsule 3   progesterone (PROMETRIUM) 100 MG capsule 100 mg daily.      AMBULATORY NON FORMULARY MEDICATION as needed. GI cocktail     dicyclomine (BENTYL) 20 MG tablet Take 1 tablet (20 mg total) by mouth 4 (four) times daily as needed for spasms. 120 tablet 2   HUMIRA PEN 40 MG/0.4ML PNKT INJECT 1 PEN  UNDER THE SKIN EVERY 14 DAYS. 2 each 3   Methotrexate Sodium (METHOTREXATE, PF,) 50 MG/2ML injection INJECT 0.8 ML INTO THE SKIN ONCE WEEKLY. (Patient taking differently: INJECT 0.7 ML INTO THE SKIN ONCE WEEKLY.) 8 mL 2   NURTEC 75 MG TBDP Take by mouth daily as needed.     omeprazole (PRILOSEC) 40 MG capsule Take 1 capsule by mouth daily as needed.     rosuvastatin (CRESTOR) 5 MG tablet Take 5 mg by mouth daily.     Tuberculin-Allergy Syringes 27G X 1/2" 1 ML MISC Use 1 syringe once weekly to inject methotrexate. 12 each 3   Current Facility-Administered Medications  Medication Dose Route Frequency Provider Last Rate Last Admin   0.9 %  sodium chloride infusion  500 mL Intravenous Once Jackquline Denmark, MD       albuterol (VENTOLIN HFA) 108 (90 Base) MCG/ACT inhaler 2 puff  2 puff Inhalation Once PRN Causey, Charlestine Massed, NP       diphenhydrAMINE (BENADRYL) injection 50 mg  50 mg Intramuscular Once PRN Causey, Charlestine Massed, NP       EPINEPHrine (EPI-PEN) injection 0.3 mg  0.3 mg Intramuscular Once PRN Gardenia Phlegm, NP       methylPREDNISolone sodium succinate (SOLU-MEDROL) 125 mg/2 mL injection 125 mg  125 mg Intramuscular Once PRN Causey, Charlestine Massed, NP        Allergies  Allergen Reactions   Penicillins Other (See Comments)    Pt. Stated," I just know I am."    Review of Systems:  neg     Physical Exam:    BP 123/76   Pulse 78   Temp 98.6 F (37 C) (Temporal)   Ht '5\' 6"'$  (1.676 m)   Wt 208 lb (94.3 kg)   SpO2 99%   BMI 33.57 kg/m  Filed Weights   08/20/22 1331  Weight: 208 lb (94.3 kg)   Constitutional:  Well-developed, in no acute distress. Psychiatric: Normal mood and affect. Behavior is normal. HEENT: Pupils normal.  Conjunctivae are normal. No scleral icterus. Cardiovascular: Normal rate, regular rhythm. No edema Pulmonary/chest: Effort normal and breath sounds normal. No wheezing, rales or rhonchi. Abdominal: Soft, nondistended. LLQ  tenderness without rebound.  Bowel sounds active throughout. There are no masses palpable. No hepatomegaly. Rectal:  defered Neurological: Alert and oriented to person place and time. Skin: Skin is warm and dry. No rashes noted.     Latest Ref Rng & Units 07/09/2022    2:28 PM 02/24/2022    3:31 PM 11/24/2021    9:41 AM  CBC  WBC 3.4 - 10.8 x10E3/uL 7.9  9.0  7.3   Hemoglobin 11.1 - 15.9 g/dL 11.3  11.6  12.1   Hematocrit 34.0 - 46.6 % 35.2  34.4  36.0   Platelets 150 - 450 x10E3/uL 444  468  477       Latest Ref Rng &  Units 07/09/2022    2:28 PM 02/24/2022    3:31 PM 11/24/2021    9:41 AM  CMP  Glucose 70 - 99 mg/dL 94  92  121   BUN 8 - 27 mg/dL '12  12  14   '$ Creatinine 0.57 - 1.00 mg/dL 0.69  0.76  0.80   Sodium 134 - 144 mmol/L 147  137  141   Potassium 3.5 - 5.2 mmol/L 4.9  5.0  4.3   Chloride 96 - 106 mmol/L 107  100  101   CO2 20 - 29 mmol/L '24  25  25   '$ Calcium 8.7 - 10.3 mg/dL 9.2  9.0  9.3   Total Protein 6.0 - 8.5 g/dL 6.4  6.7  6.5   Total Bilirubin 0.0 - 1.2 mg/dL <0.2  <0.2  0.2   Alkaline Phos 44 - 121 IU/L 60  66  59   AST 0 - 40 IU/L '13  16  15   '$ ALT 0 - 32 IU/L '10  9  10       '$ [name], MD 08/20/2022, 2:14 PM  Cc: Dr. Marco Collie

## 2022-08-20 NOTE — Progress Notes (Signed)
Pt resting comfortably. VSS. Airway intact. SBAR complete to RN. All questions answered.   

## 2022-08-20 NOTE — Progress Notes (Signed)
Called to room to assist during endoscopic procedure.  Patient ID and intended procedure confirmed with present staff. Received instructions for my participation in the procedure from the performing physician.  

## 2022-08-21 ENCOUNTER — Telehealth: Payer: Self-pay | Admitting: *Deleted

## 2022-08-21 NOTE — Telephone Encounter (Signed)
I returned patient's call.  She stated that the recent colonoscopy showed that her Crohn's disease is in remission.  She wanted to discuss tapering schedule for methotrexate.  During the last office visit we discussed tapering methotrexate by 0.1 mL every 3 months.  Today we discussed that she might taper methotrexate by 0.1 mL every 2 months.  I advised her to contact us if she has a flare of episcleritis or arthritis.  She was advised to increase the dose of methotrexate if she develops a flare.

## 2022-08-21 NOTE — Telephone Encounter (Signed)
Patient contacted the office and left message stating that she had a colonoscopy. Patient had the procedure yesterday. Patient states she would like for you to review the note from the procedure and advise what effects it may have on the MTX taper.

## 2022-08-21 NOTE — Telephone Encounter (Signed)
  Follow up Call-    Row Labels 08/20/2022    1:32 PM  Call back number   Section Header. No data exists in this row.   Post procedure Call Back phone  #   [phone]  Permission to leave phone message   Yes     Patient questions:  Do you have a fever, pain , or abdominal swelling? No. Pain Score  0 *  Have you tolerated food without any problems? Yes.    Have you been able to return to your normal activities? Yes.    Do you have any questions about your discharge instructions: Diet   No. Medications  No. Follow up visit  No.  Do you have questions or concerns about your Care? No.  Actions: * If pain score is 4 or above: No action needed, pain <4.

## 2022-08-31 ENCOUNTER — Encounter: Payer: Self-pay | Admitting: Gastroenterology

## 2022-09-03 DIAGNOSIS — E785 Hyperlipidemia, unspecified: Secondary | ICD-10-CM | POA: Diagnosis not present

## 2022-09-14 ENCOUNTER — Other Ambulatory Visit: Payer: Self-pay | Admitting: Gastroenterology

## 2022-09-14 DIAGNOSIS — Z20822 Contact with and (suspected) exposure to covid-19: Secondary | ICD-10-CM | POA: Diagnosis not present

## 2022-09-14 DIAGNOSIS — Z6832 Body mass index (BMI) 32.0-32.9, adult: Secondary | ICD-10-CM | POA: Diagnosis not present

## 2022-09-14 DIAGNOSIS — R051 Acute cough: Secondary | ICD-10-CM | POA: Diagnosis not present

## 2022-09-14 DIAGNOSIS — J45901 Unspecified asthma with (acute) exacerbation: Secondary | ICD-10-CM | POA: Diagnosis not present

## 2022-09-14 DIAGNOSIS — R059 Cough, unspecified: Secondary | ICD-10-CM | POA: Diagnosis not present

## 2022-09-17 ENCOUNTER — Other Ambulatory Visit: Payer: Self-pay | Admitting: Physician Assistant

## 2022-09-17 DIAGNOSIS — I10 Essential (primary) hypertension: Secondary | ICD-10-CM | POA: Diagnosis not present

## 2022-09-17 DIAGNOSIS — G43909 Migraine, unspecified, not intractable, without status migrainosus: Secondary | ICD-10-CM | POA: Diagnosis not present

## 2022-09-17 DIAGNOSIS — J45901 Unspecified asthma with (acute) exacerbation: Secondary | ICD-10-CM | POA: Diagnosis not present

## 2022-09-17 DIAGNOSIS — E785 Hyperlipidemia, unspecified: Secondary | ICD-10-CM | POA: Diagnosis not present

## 2022-09-17 DIAGNOSIS — Z6832 Body mass index (BMI) 32.0-32.9, adult: Secondary | ICD-10-CM | POA: Diagnosis not present

## 2022-09-17 NOTE — Telephone Encounter (Signed)
Next Visit: 12/23/2022  Last Visit: 07/13/2022  Last Fill: 02/25/2022  DX:  Crohn's disease with complication, unspecified gastrointestinal tract location    Current Dose per office note 07/13/2022: methotrexate 0.8 mL subcu injections once weekly   Labs: 07/09/2022  CBC is normal.  CMP shows elevated sodium and chloride.  Patient should increase water intake.  Patient should discuss increase sodium level with her PCP.  Please forward results to her PCP.   Okay to refill MTX?

## 2022-10-07 ENCOUNTER — Telehealth: Payer: Self-pay

## 2022-10-07 ENCOUNTER — Other Ambulatory Visit: Payer: Self-pay

## 2022-10-07 DIAGNOSIS — Z79899 Other long term (current) drug therapy: Secondary | ICD-10-CM

## 2022-10-07 NOTE — Telephone Encounter (Signed)
Patient contacted the office and asked for labs to be sent to [facility] released to [place].

## 2022-10-13 DIAGNOSIS — Z79899 Other long term (current) drug therapy: Secondary | ICD-10-CM | POA: Diagnosis not present

## 2022-10-20 LAB — CBC WITH DIFFERENTIAL/PLATELET
Basophils Absolute: 0.1 10*3/uL (ref 0.0–0.2)
Basos: 1 %
EOS (ABSOLUTE): 0.1 10*3/uL (ref 0.0–0.4)
Eos: 1 %
Hematocrit: 35.1 % (ref 34.0–46.6)
Hemoglobin: 11.5 g/dL (ref 11.1–15.9)
Immature Grans (Abs): 0 10*3/uL (ref 0.0–0.1)
Immature Granulocytes: 0 %
Lymphocytes Absolute: 2.2 10*3/uL (ref 0.7–3.1)
Lymphs: 24 %
MCH: 29.3 pg (ref 26.6–33.0)
MCHC: 32.8 g/dL (ref 31.5–35.7)
MCV: 89 fL (ref 79–97)
Monocytes Absolute: 0.6 10*3/uL (ref 0.1–0.9)
Monocytes: 6 %
Neutrophils Absolute: 6.1 10*3/uL (ref 1.4–7.0)
Neutrophils: 68 %
Platelets: 512 10*3/uL — ABNORMAL HIGH (ref 150–450)
RBC: 3.93 x10E6/uL (ref 3.77–5.28)
RDW: 14.5 % (ref 11.7–15.4)
WBC: 9.1 10*3/uL (ref 3.4–10.8)

## 2022-10-20 LAB — QUANTIFERON-TB GOLD PLUS
QuantiFERON Mitogen Value: >10 IU/mL
QuantiFERON Nil Value: 0.03 IU/mL
QuantiFERON TB1 Ag Value: 0.04 IU/mL
QuantiFERON TB2 Ag Value: 0.05 IU/mL
QuantiFERON-TB Gold Plus: NEGATIVE

## 2022-10-20 LAB — CMP14+EGFR
ALT: 10 IU/L (ref 0–32)
AST: 12 IU/L (ref 0–40)
Albumin/Globulin Ratio: 1.6 (ref 1.2–2.2)
Albumin: 4.1 g/dL (ref 3.9–4.9)
Alkaline Phosphatase: 70 IU/L (ref 44–121)
BUN/Creatinine Ratio: 27 (ref 12–28)
BUN: 21 mg/dL (ref 8–27)
Bilirubin Total: <0.2 mg/dL (ref 0.0–1.2)
CO2: 24 mmol/L (ref 20–29)
Calcium: 9.3 mg/dL (ref 8.7–10.3)
Chloride: 103 mmol/L (ref 96–106)
Creatinine, Ser: 0.78 mg/dL (ref 0.57–1.00)
Globulin, Total: 2.6 g/dL (ref 1.5–4.5)
Glucose: 103 mg/dL — ABNORMAL HIGH (ref 70–99)
Potassium: 4.6 mmol/L (ref 3.5–5.2)
Sodium: 142 mmol/L (ref 134–144)
Total Protein: 6.7 g/dL (ref 6.0–8.5)
eGFR: 84 mL/min/{1.73_m2} (ref >59)

## 2022-11-02 ENCOUNTER — Other Ambulatory Visit: Payer: Self-pay | Admitting: Gastroenterology

## 2022-11-09 ENCOUNTER — Other Ambulatory Visit: Payer: Self-pay | Admitting: Gastroenterology

## 2022-11-09 DIAGNOSIS — K50919 Crohn's disease, unspecified, with unspecified complications: Secondary | ICD-10-CM

## 2022-12-09 NOTE — Progress Notes (Signed)
Office Visit Note  Patient: Tammy Carey             Date of Birth: Jul 19, 1956           MRN: [id number]             PCP: Abner Greenspan, MD Referring: Abner Greenspan, MD Visit Date: 12/23/2022 Occupation: @GUAROCC @  Subjective:  Medication monitoring  History of Present Illness: Tammy Carey is a 67 y.o. female with history of Crohn's, inflammatory arthritis, episcleritis and sacroiliitis.  She states she has been tapering methotrexate and currently on 0.2 mL subcu weekly.  She continues to take Humira 40 mg subcu every other week.  She has not had a flare.  She continues to have some pain and stiffness in her hands and her knee joints due to underlying osteoarthritis.  She denies any history of joint inflammation.  There has been no flares of episcleritis, Crohn's or sacroiliitis.    Activities of Daily Living:  Patient reports morning stiffness for 0 minutes.   Patient Denies nocturnal pain.  Difficulty dressing/grooming: Denies Difficulty climbing stairs: Reports Difficulty getting out of chair: Reports Difficulty using hands for taps, buttons, cutlery, and/or writing: Denies  Review of Systems  Constitutional:  Negative for fatigue.  HENT:  Negative for mouth sores and mouth dryness.   Eyes:  Negative for dryness.  Respiratory:  Negative for shortness of breath.   Cardiovascular:  Negative for chest pain and palpitations.  Gastrointestinal:  Negative for blood in stool, constipation and diarrhea.  Endocrine: Negative for increased urination.  Genitourinary:  Negative for involuntary urination.  Musculoskeletal:  Positive for joint pain, joint pain, myalgias, muscle weakness, muscle tenderness and myalgias. Negative for gait problem, joint swelling and morning stiffness.  Skin:  Positive for rash. Negative for color change, hair loss and sensitivity to sunlight.  Allergic/Immunologic: Negative for susceptible to infections.  Neurological:  Negative for dizziness and  headaches.  Hematological:  Negative for swollen glands.  Psychiatric/Behavioral:  Negative for depressed mood and sleep disturbance. The patient is not nervous/anxious.     PMFS History:  Patient Active Problem List   Diagnosis Date Noted   Bilateral sacroiliitis (HCC) 03/20/2021   Primary osteoarthritis of both hands 03/20/2021   Primary osteoarthritis of both knees 03/20/2021   Crohn's disease of both small and large intestine without complication (HCC) 12/03/2020   Nuclear sclerotic cataract of both eyes 12/03/2020   Hx of migraines 07/02/2020   History of asthma 07/02/2020   History of IBS 07/02/2020   History of diverticulitis 07/02/2020   Crohn's disease with complication (HCC) 07/02/2020   Essential hypertension 07/02/2020   Episcleritis of left eye 07/02/2020    Past Medical History:  Diagnosis Date   Anemia    Asthma    Crohn's disease (HCC)    Hx of migraine headaches    Hypertension    Pulsatile tinnitus     Family History  Problem Relation Age of Onset   Hypertension Mother    Hypercholesterolemia Mother    Asthma Father    COPD Father    Prostate cancer Father    Hypertension Brother    Hypercholesterolemia Brother    ADD / ADHD Son    Rheum arthritis Cousin    Rheum arthritis Cousin    Colon cancer Neg Hx    Past Surgical History:  Procedure Laterality Date   APPENDECTOMY     BREAST ENHANCEMENT SURGERY     Per patient - in the 80's, replaced  around 2008   COLONOSCOPY  11/05/2008   Mild colonic diverticulosis. Otherwise normal colonoscopy to TI. Minimal activity by endoscopic criteria.    ESOPHAGOGASTRODUODENOSCOPY  11/01/2008   Normal EGD.    KNEE SURGERY Right 10/2021   miniscus repair   LAPAROTOMY     for endometriosis   Social History   Social History Narrative   Not on file   Immunization History  Administered Date(s) Administered   Moderna Sars-Covid-2 Vaccination 08/29/2020   PFIZER(Purple Top)SARS-COV-2 Vaccination 08/23/2019,  09/13/2019     Objective: Vital Signs: BP (!) 135/90 (BP Location: Left Arm, Patient Position: Sitting, Cuff Size: Normal)   Pulse 72   Resp 15   Ht 5' 6.5" (1.689 m)   Wt 209 lb 6.4 oz (95 kg)   BMI 33.29 kg/m    Physical Exam Vitals and nursing note reviewed.  Constitutional:      Appearance: She is well-developed.  HENT:     Head: Normocephalic and atraumatic.  Eyes:     Conjunctiva/sclera: Conjunctivae normal.  Cardiovascular:     Rate and Rhythm: Normal rate and regular rhythm.     Heart sounds: Normal heart sounds.  Pulmonary:     Effort: Pulmonary effort is normal.     Breath sounds: Normal breath sounds.  Abdominal:     General: Bowel sounds are normal.     Palpations: Abdomen is soft.  Musculoskeletal:     Cervical back: Normal range of motion.  Lymphadenopathy:     Cervical: No cervical adenopathy.  Skin:    General: Skin is warm and dry.     Capillary Refill: Capillary refill takes less than 2 seconds.  Neurological:     Mental Status: She is alert and oriented to person, place, and time.  Psychiatric:        Behavior: Behavior normal.      Musculoskeletal Exam: Cervical, thoracic and lumbar spine were in good range of motion.  She had no SI joint tenderness.  Shoulders, elbows, wrist joints, MCPs PIPs and DIPs with good range of motion.  She had bilateral CMC PIP and DIP thickening.  Hip joints and knee joints in good range of motion without any warmth swelling or effusion.  She had bilateral first MTP thickening.  No synovitis was noted.  There was no plantar fasciitis or Achilles tendinitis.  CDAI Exam: CDAI Score: -- Patient Global: --; Provider Global: -- Swollen: --; Tender: -- Joint Exam 12/23/2022   No joint exam has been documented for this visit   There is currently no information documented on the homunculus. Go to the Rheumatology activity and complete the homunculus joint exam.  Investigation: No additional findings.  Imaging: No  results found.  Recent Labs: Lab Results  Component Value Date   WBC 9.1 10/13/2022   HGB 11.5 10/13/2022   PLT 512 (H) 10/13/2022   NA 142 10/13/2022   K 4.6 10/13/2022   CL 103 10/13/2022   CO2 24 10/13/2022   GLUCOSE 103 (H) 10/13/2022   BUN 21 10/13/2022   CREATININE 0.78 10/13/2022   BILITOT <0.2 10/13/2022   ALKPHOS 70 10/13/2022   AST 12 10/13/2022   ALT 10 10/13/2022   PROT 6.7 10/13/2022   ALBUMIN 4.1 10/13/2022   CALCIUM 9.3 10/13/2022   GFRAA 111 09/04/2020   QFTBGOLDPLUS Negative 10/13/2022    Speciality Comments: No specialty comments available.  Procedures:  No procedures performed Allergies: Penicillins   Assessment / Plan:     Visit Diagnoses: Crohn's disease  with complication, unspecified gastrointestinal tract location [facility]) - She has been followed by Dr. Chales Abrahams.  She was treated with Lialda in the past and now on Humira by Dr. Chales Abrahams.  She has been taking Humira 40 mg subcu every other week without any interruption.  She has reduced methotrexate to 0.2 mL subcu weekly.  She has not had any flares.  I advised her to discontinue methotrexate.  Chronic inflammatory arthritis-she had no synovitis on examination.  She denies any history of Planter fasciitis or Achilles tendinitis.  She had no SI joint tenderness.  High risk medication use - Humira 40 mg subcu every 14 days, methotrexate 0.2 mL subcu injections once weekly, folic acid 2 mg po qd. labs obtained on October 13, 2022 CBC and CMP were normal except platelets of 512.  TB Gold was negative on October 13, 2022.  Annual TB Gold was advised.  She will have labs in June and every 3 months to monitor for drug toxicity.  Information on immunization was placed in the AVS.  She was advised to hold Humira if she develops an infection resume after the infection resolves.  Annual skin examination to screen for skin cancer was advised.  Use of sunscreen and sun protection was discussed.  Episcleritis of left eye -patient  denies any flares of episcleritis.  She used to have episcleritis every 8 weeks since 2020.Last episode 01/2020.  She stays asymptomatic.  She is followed by Dr. Sherryll Burger.  Bilateral sacroiliitis (HCC)-she denies SI joint pain.  She had no SI joint tenderness on examination.  Primary osteoarthritis of both hands-she had bilateral PIP and DIP thickening and CMC thickening.  Joint protection muscle strengthening was discussed.  Primary osteoarthritis of both knees-she continues to have some discomfort in her knee joints.  No warmth swelling or effusion was noted.  Lower extremity muscles strengthening exercises were discussed.  Thrombocytosis-she continues to have thrombocytosis.  I offered referral to hematology but she would like to wait at this time.  History of diverticulitis -she had no recurrence of diverticulitis since the last visit.  Patient had 2 episodes of diverticulitis since March 2023.  She has been followed by Dr. Chales Abrahams.  Other medical problems are listed as follows:  Hepatic cyst  History of IBS  History of asthma  Thyroid nodule  Essential hypertension-blood pressure was elevated today at 135/90.  Repeat blood pressure was 134/83.  Other fatigue  Hx of migraines  Osteoporosis screening - Left femoral neck T-score - 0.9 BMD 0.919 on February 17, 2022.  Calcium rich diet and vitamin D was advised.  Regular exercise was advised.  Orders: No orders of the defined types were placed in this encounter.  No orders of the defined types were placed in this encounter.   Follow-Up Instructions: Return for Crohn's disease.   Pollyann Savoy, MD  Note - This record has been created using Animal nutritionist.  Chart creation errors have been sought, but may not always  have been located. Such creation errors do not reflect on  the standard of medical care.

## 2022-12-17 ENCOUNTER — Other Ambulatory Visit: Payer: Self-pay | Admitting: Physician Assistant

## 2022-12-17 NOTE — Telephone Encounter (Signed)
Last Fill: 02/04/2022  Next Visit: 12/23/2022  Last Visit: 07/13/2022  DX: Crohn's disease with complication, unspecified gastrointestinal tract location   Current Dose per office note on 07/13/2022: folic acid 2 mg po qd.   Okay to refill folic acid?

## 2022-12-21 DIAGNOSIS — H52223 Regular astigmatism, bilateral: Secondary | ICD-10-CM | POA: Diagnosis not present

## 2022-12-21 DIAGNOSIS — H25813 Combined forms of age-related cataract, bilateral: Secondary | ICD-10-CM | POA: Diagnosis not present

## 2022-12-21 DIAGNOSIS — Z79899 Other long term (current) drug therapy: Secondary | ICD-10-CM | POA: Diagnosis not present

## 2022-12-21 DIAGNOSIS — H5203 Hypermetropia, bilateral: Secondary | ICD-10-CM | POA: Diagnosis not present

## 2022-12-21 DIAGNOSIS — H524 Presbyopia: Secondary | ICD-10-CM | POA: Diagnosis not present

## 2022-12-23 ENCOUNTER — Ambulatory Visit: Payer: Medicare PPO | Attending: Rheumatology | Admitting: Rheumatology

## 2022-12-23 ENCOUNTER — Encounter: Payer: Self-pay | Admitting: Rheumatology

## 2022-12-23 VITALS — BP 134/83 | HR 76 | Resp 15 | Ht 66.5 in | Wt 209.4 lb

## 2022-12-23 DIAGNOSIS — R5383 Other fatigue: Secondary | ICD-10-CM

## 2022-12-23 DIAGNOSIS — H15102 Unspecified episcleritis, left eye: Secondary | ICD-10-CM

## 2022-12-23 DIAGNOSIS — M19042 Primary osteoarthritis, left hand: Secondary | ICD-10-CM

## 2022-12-23 DIAGNOSIS — I10 Essential (primary) hypertension: Secondary | ICD-10-CM

## 2022-12-23 DIAGNOSIS — D75839 Thrombocytosis, unspecified: Secondary | ICD-10-CM | POA: Diagnosis not present

## 2022-12-23 DIAGNOSIS — M1990 Unspecified osteoarthritis, unspecified site: Secondary | ICD-10-CM | POA: Diagnosis not present

## 2022-12-23 DIAGNOSIS — M461 Sacroiliitis, not elsewhere classified: Secondary | ICD-10-CM | POA: Diagnosis not present

## 2022-12-23 DIAGNOSIS — Z8709 Personal history of other diseases of the respiratory system: Secondary | ICD-10-CM

## 2022-12-23 DIAGNOSIS — K50919 Crohn's disease, unspecified, with unspecified complications: Secondary | ICD-10-CM

## 2022-12-23 DIAGNOSIS — M19041 Primary osteoarthritis, right hand: Secondary | ICD-10-CM

## 2022-12-23 DIAGNOSIS — M170 Bilateral primary osteoarthritis of knee: Secondary | ICD-10-CM

## 2022-12-23 DIAGNOSIS — Z79899 Other long term (current) drug therapy: Secondary | ICD-10-CM

## 2022-12-23 DIAGNOSIS — E041 Nontoxic single thyroid nodule: Secondary | ICD-10-CM

## 2022-12-23 DIAGNOSIS — K7689 Other specified diseases of liver: Secondary | ICD-10-CM

## 2022-12-23 DIAGNOSIS — Z8719 Personal history of other diseases of the digestive system: Secondary | ICD-10-CM | POA: Diagnosis not present

## 2022-12-23 DIAGNOSIS — Z13820 Encounter for screening for osteoporosis: Secondary | ICD-10-CM

## 2022-12-23 DIAGNOSIS — Z8669 Personal history of other diseases of the nervous system and sense organs: Secondary | ICD-10-CM

## 2022-12-23 NOTE — Patient Instructions (Addendum)
Standing Labs We placed an order today for your standing lab work.   Please have your standing labs drawn in June and every  3 months  Please have your labs drawn 2 weeks prior to your appointment so that the provider can discuss your lab results at your appointment, if possible.  Please note that you may see your imaging and lab results in MyChart before we have reviewed them. We will contact you once all results are reviewed. Please allow our office up to 72 hours to thoroughly review all of the results before contacting the office for clarification of your results.  WALK-IN LAB HOURS  Monday through Thursday from 8:00 am -12:30 pm and 1:00 pm-5:00 pm and Friday from 8:00 am-12:00 pm.  Patients with office visits requiring labs will be seen before walk-in labs.  You may encounter longer than normal wait times. Please allow additional time. Wait times may be shorter on  Monday and Thursday afternoons.  We do not book appointments for walk-in labs. We appreciate your patience and understanding with our staff.   Labs are drawn by [facility]. Please bring your co-pay at the time of your lab draw.  You may receive a bill from [facility] for your lab work.  Please note if you are on Hydroxychloroquine and and an order has been placed for a Hydroxychloroquine level,  you will need to have it drawn 4 hours or more after your last dose.  If you wish to have your labs drawn at another location, please call the office 24 hours in advance so we can fax the orders.  The office is located at [address], [address], [place], [place] [address]   If you have any questions regarding directions or hours of operation,  please call [phone].   As a reminder, please drink plenty of water prior to coming for your lab work. Thanks!   Vaccines You are taking a medication(s) that can suppress your immune system.  The following immunizations are recommended: Flu annually Covid-19  Td/Tdap (tetanus,  diphtheria, pertussis) every 10 years Pneumonia (Prevnar 15 then Pneumovax 23 at least 1 year apart.  Alternatively, can take Prevnar 20 without needing additional dose) Shingrix: 2 doses from 4 weeks to 6 months apart  Please check with your PCP to make sure you are up to date.   If you have signs or symptoms of an infection or start antibiotics: First, call your PCP for workup of your infection. Hold your medication through the infection, until you complete your antibiotics, and until symptoms resolve if you take the following: Injectable medication (Actemra, Benlysta, Cimzia, Cosentyx, Enbrel, Humira, Kevzara, Orencia, Remicade, Simponi, Stelara, Taltz, Tremfya) Methotrexate Leflunomide (Arava) Mycophenolate (Cellcept) Harriette Ohara, Olumiant, or Rinvoq  Please get an annual skin examination to screen for skin cancer.  Please use sunscreen and sun protection

## 2023-01-12 ENCOUNTER — Other Ambulatory Visit: Payer: Self-pay

## 2023-01-12 DIAGNOSIS — Z79899 Other long term (current) drug therapy: Secondary | ICD-10-CM

## 2023-01-12 DIAGNOSIS — K50919 Crohn's disease, unspecified, with unspecified complications: Secondary | ICD-10-CM

## 2023-01-12 NOTE — Progress Notes (Unsigned)
Patient contacted the office to request her labs be released to [facility] so she can get them drawn this week.

## 2023-01-25 DIAGNOSIS — Z79899 Other long term (current) drug therapy: Secondary | ICD-10-CM | POA: Diagnosis not present

## 2023-01-26 LAB — CBC WITH DIFFERENTIAL/PLATELET
Basophils Absolute: 0.1 10*3/uL (ref 0.0–0.2)
Basos: 1 %
EOS (ABSOLUTE): 0.1 10*3/uL (ref 0.0–0.4)
Eos: 2 %
Hematocrit: 36.8 % (ref 34.0–46.6)
Hemoglobin: 11.5 g/dL (ref 11.1–15.9)
Immature Grans (Abs): 0 10*3/uL (ref 0.0–0.1)
Immature Granulocytes: 0 %
Lymphocytes Absolute: 2.6 10*3/uL (ref 0.7–3.1)
Lymphs: 36 %
MCH: 28.3 pg (ref 26.6–33.0)
MCHC: 31.3 g/dL — ABNORMAL LOW (ref 31.5–35.7)
MCV: 91 fL (ref 79–97)
Monocytes Absolute: 0.6 10*3/uL (ref 0.1–0.9)
Monocytes: 8 %
Neutrophils Absolute: 3.8 10*3/uL (ref 1.4–7.0)
Neutrophils: 53 %
Platelets: 445 10*3/uL (ref 150–450)
RBC: 4.06 x10E6/uL (ref 3.77–5.28)
RDW: 14.5 % (ref 11.7–15.4)
WBC: 7.2 10*3/uL (ref 3.4–10.8)

## 2023-01-26 LAB — CMP14+EGFR
ALT: 11 IU/L (ref 0–32)
AST: 15 IU/L (ref 0–40)
Albumin: 3.9 g/dL (ref 3.9–4.9)
Alkaline Phosphatase: 68 IU/L (ref 44–121)
BUN/Creatinine Ratio: 18 (ref 12–28)
BUN: 13 mg/dL (ref 8–27)
Bilirubin Total: 0.2 mg/dL (ref 0.0–1.2)
CO2: 27 mmol/L (ref 20–29)
Calcium: 9 mg/dL (ref 8.7–10.3)
Chloride: 103 mmol/L (ref 96–106)
Creatinine, Ser: 0.73 mg/dL (ref 0.57–1.00)
Globulin, Total: 2.5 g/dL (ref 1.5–4.5)
Glucose: 105 mg/dL — ABNORMAL HIGH (ref 70–99)
Potassium: 4.7 mmol/L (ref 3.5–5.2)
Sodium: 138 mmol/L (ref 134–144)
Total Protein: 6.4 g/dL (ref 6.0–8.5)
eGFR: 91 mL/min/{1.73_m2} (ref >59)

## 2023-02-09 DIAGNOSIS — Z6832 Body mass index (BMI) 32.0-32.9, adult: Secondary | ICD-10-CM | POA: Diagnosis not present

## 2023-02-09 DIAGNOSIS — B3731 Acute candidiasis of vulva and vagina: Secondary | ICD-10-CM | POA: Diagnosis not present

## 2023-02-09 DIAGNOSIS — K219 Gastro-esophageal reflux disease without esophagitis: Secondary | ICD-10-CM | POA: Diagnosis not present

## 2023-02-09 DIAGNOSIS — K5090 Crohn's disease, unspecified, without complications: Secondary | ICD-10-CM | POA: Diagnosis not present

## 2023-02-09 DIAGNOSIS — L0100 Impetigo, unspecified: Secondary | ICD-10-CM | POA: Diagnosis not present

## 2023-03-05 DIAGNOSIS — E785 Hyperlipidemia, unspecified: Secondary | ICD-10-CM | POA: Diagnosis not present

## 2023-03-09 DIAGNOSIS — E669 Obesity, unspecified: Secondary | ICD-10-CM | POA: Diagnosis not present

## 2023-03-09 DIAGNOSIS — I700 Atherosclerosis of aorta: Secondary | ICD-10-CM | POA: Diagnosis not present

## 2023-03-09 DIAGNOSIS — I10 Essential (primary) hypertension: Secondary | ICD-10-CM | POA: Diagnosis not present

## 2023-03-09 DIAGNOSIS — E785 Hyperlipidemia, unspecified: Secondary | ICD-10-CM | POA: Diagnosis not present

## 2023-03-16 DIAGNOSIS — Z1331 Encounter for screening for depression: Secondary | ICD-10-CM | POA: Diagnosis not present

## 2023-03-16 DIAGNOSIS — Z1339 Encounter for screening examination for other mental health and behavioral disorders: Secondary | ICD-10-CM | POA: Diagnosis not present

## 2023-03-16 DIAGNOSIS — Z139 Encounter for screening, unspecified: Secondary | ICD-10-CM | POA: Diagnosis not present

## 2023-03-16 DIAGNOSIS — Z0000 Encounter for general adult medical examination without abnormal findings: Secondary | ICD-10-CM | POA: Diagnosis not present

## 2023-03-16 DIAGNOSIS — Z1389 Encounter for screening for other disorder: Secondary | ICD-10-CM | POA: Diagnosis not present

## 2023-03-16 DIAGNOSIS — Z136 Encounter for screening for cardiovascular disorders: Secondary | ICD-10-CM | POA: Diagnosis not present

## 2023-03-18 DIAGNOSIS — E669 Obesity, unspecified: Secondary | ICD-10-CM | POA: Diagnosis not present

## 2023-03-18 DIAGNOSIS — Z713 Dietary counseling and surveillance: Secondary | ICD-10-CM | POA: Diagnosis not present

## 2023-03-18 DIAGNOSIS — Z6834 Body mass index (BMI) 34.0-34.9, adult: Secondary | ICD-10-CM | POA: Diagnosis not present

## 2023-03-18 DIAGNOSIS — I700 Atherosclerosis of aorta: Secondary | ICD-10-CM | POA: Diagnosis not present

## 2023-03-19 DIAGNOSIS — Z1231 Encounter for screening mammogram for malignant neoplasm of breast: Secondary | ICD-10-CM | POA: Diagnosis not present

## 2023-03-23 DIAGNOSIS — D485 Neoplasm of uncertain behavior of skin: Secondary | ICD-10-CM | POA: Diagnosis not present

## 2023-03-23 DIAGNOSIS — D2261 Melanocytic nevi of right upper limb, including shoulder: Secondary | ICD-10-CM | POA: Diagnosis not present

## 2023-03-23 DIAGNOSIS — L738 Other specified follicular disorders: Secondary | ICD-10-CM | POA: Diagnosis not present

## 2023-03-23 DIAGNOSIS — L821 Other seborrheic keratosis: Secondary | ICD-10-CM | POA: Diagnosis not present

## 2023-03-23 DIAGNOSIS — D220 Melanocytic nevi of lip: Secondary | ICD-10-CM | POA: Diagnosis not present

## 2023-03-23 DIAGNOSIS — D3617 Benign neoplasm of peripheral nerves and autonomic nervous system of trunk, unspecified: Secondary | ICD-10-CM | POA: Diagnosis not present

## 2023-03-23 DIAGNOSIS — D2262 Melanocytic nevi of left upper limb, including shoulder: Secondary | ICD-10-CM | POA: Diagnosis not present

## 2023-03-23 DIAGNOSIS — L720 Epidermal cyst: Secondary | ICD-10-CM | POA: Diagnosis not present

## 2023-03-25 DIAGNOSIS — Z6833 Body mass index (BMI) 33.0-33.9, adult: Secondary | ICD-10-CM | POA: Diagnosis not present

## 2023-03-25 DIAGNOSIS — Z6834 Body mass index (BMI) 34.0-34.9, adult: Secondary | ICD-10-CM | POA: Diagnosis not present

## 2023-03-25 DIAGNOSIS — E669 Obesity, unspecified: Secondary | ICD-10-CM | POA: Diagnosis not present

## 2023-03-25 DIAGNOSIS — Z713 Dietary counseling and surveillance: Secondary | ICD-10-CM | POA: Diagnosis not present

## 2023-04-01 DIAGNOSIS — E669 Obesity, unspecified: Secondary | ICD-10-CM | POA: Diagnosis not present

## 2023-04-01 DIAGNOSIS — Z6833 Body mass index (BMI) 33.0-33.9, adult: Secondary | ICD-10-CM | POA: Diagnosis not present

## 2023-04-01 DIAGNOSIS — Z6834 Body mass index (BMI) 34.0-34.9, adult: Secondary | ICD-10-CM | POA: Diagnosis not present

## 2023-04-01 DIAGNOSIS — Z713 Dietary counseling and surveillance: Secondary | ICD-10-CM | POA: Diagnosis not present

## 2023-04-01 DIAGNOSIS — Z1231 Encounter for screening mammogram for malignant neoplasm of breast: Secondary | ICD-10-CM | POA: Diagnosis not present

## 2023-04-07 DIAGNOSIS — Z713 Dietary counseling and surveillance: Secondary | ICD-10-CM | POA: Diagnosis not present

## 2023-04-07 DIAGNOSIS — Z6834 Body mass index (BMI) 34.0-34.9, adult: Secondary | ICD-10-CM | POA: Diagnosis not present

## 2023-04-07 DIAGNOSIS — Z6833 Body mass index (BMI) 33.0-33.9, adult: Secondary | ICD-10-CM | POA: Diagnosis not present

## 2023-04-07 DIAGNOSIS — E669 Obesity, unspecified: Secondary | ICD-10-CM | POA: Diagnosis not present

## 2023-05-06 DIAGNOSIS — Z713 Dietary counseling and surveillance: Secondary | ICD-10-CM | POA: Diagnosis not present

## 2023-05-06 DIAGNOSIS — E66811 Obesity, class 1: Secondary | ICD-10-CM | POA: Diagnosis not present

## 2023-05-06 DIAGNOSIS — Z6832 Body mass index (BMI) 32.0-32.9, adult: Secondary | ICD-10-CM | POA: Diagnosis not present

## 2023-05-21 NOTE — Progress Notes (Deleted)
Office Visit Note  Patient: Tammy Carey             Date of Birth: 08-02-1956           MRN: [id number]             PCP: Abner Greenspan, MD Referring: Abner Greenspan, MD Visit Date: 06/03/2023 Occupation: @GUAROCC @  Subjective:  No chief complaint on file.   History of Present Illness: Tammy Carey is a 67 y.o. female ***     Activities of Daily Living:  Patient reports morning stiffness for *** {minute/hour:19697}.   Patient {ACTIONS;DENIES/REPORTS:21021675::"Denies"} nocturnal pain.  Difficulty dressing/grooming: {ACTIONS;DENIES/REPORTS:21021675::"Denies"} Difficulty climbing stairs: {ACTIONS;DENIES/REPORTS:21021675::"Denies"} Difficulty getting out of chair: {ACTIONS;DENIES/REPORTS:21021675::"Denies"} Difficulty using hands for taps, buttons, cutlery, and/or writing: {ACTIONS;DENIES/REPORTS:21021675::"Denies"}  No Rheumatology ROS completed.   PMFS History:  Patient Active Problem List   Diagnosis Date Noted   Bilateral sacroiliitis (HCC) 03/20/2021   Primary osteoarthritis of both hands 03/20/2021   Primary osteoarthritis of both knees 03/20/2021   Crohn's disease of both small and large intestine without complication (HCC) 12/03/2020   Nuclear sclerotic cataract of both eyes 12/03/2020   Hx of migraines 07/02/2020   History of asthma 07/02/2020   History of IBS 07/02/2020   History of diverticulitis 07/02/2020   Crohn's disease with complication (HCC) 07/02/2020   Essential hypertension 07/02/2020   Episcleritis of left eye 07/02/2020    Past Medical History:  Diagnosis Date   Anemia    Asthma    Crohn's disease (HCC)    Hx of migraine headaches    Hypertension    Pulsatile tinnitus     Family History  Problem Relation Age of Onset   Hypertension Mother    Hypercholesterolemia Mother    Asthma Father    COPD Father    Prostate cancer Father    Hypertension Brother    Hypercholesterolemia Brother    ADD / ADHD Son    Rheum arthritis Cousin     Rheum arthritis Cousin    Colon cancer Neg Hx    Past Surgical History:  Procedure Laterality Date   APPENDECTOMY     BREAST ENHANCEMENT SURGERY     Per patient - in the 80's, replaced around 2008   COLONOSCOPY  11/05/2008   Mild colonic diverticulosis. Otherwise normal colonoscopy to TI. Minimal activity by endoscopic criteria.    ESOPHAGOGASTRODUODENOSCOPY  11/01/2008   Normal EGD.    KNEE SURGERY Right 10/2021   miniscus repair   LAPAROTOMY     for endometriosis   Social History   Social History Narrative   Not on file   Immunization History  Administered Date(s) Administered   Moderna Sars-Covid-2 Vaccination 08/29/2020   PFIZER(Purple Top)SARS-COV-2 Vaccination 08/23/2019, 09/13/2019     Objective: Vital Signs: There were no vitals taken for this visit.   Physical Exam   Musculoskeletal Exam: ***  CDAI Exam: CDAI Score: -- Patient Global: --; Provider Global: -- Swollen: --; Tender: -- Joint Exam 06/03/2023   No joint exam has been documented for this visit   There is currently no information documented on the homunculus. Go to the Rheumatology activity and complete the homunculus joint exam.  Investigation: No additional findings.  Imaging: No results found.  Recent Labs: Lab Results  Component Value Date   WBC 7.2 01/25/2023   HGB 11.5 01/25/2023   PLT 445 01/25/2023   NA 138 01/25/2023   K 4.7 01/25/2023   CL 103 01/25/2023   CO2 27 01/25/2023   GLUCOSE  105 (H) 01/25/2023   BUN 13 01/25/2023   CREATININE 0.73 01/25/2023   BILITOT 0.2 01/25/2023   ALKPHOS 68 01/25/2023   AST 15 01/25/2023   ALT 11 01/25/2023   PROT 6.4 01/25/2023   ALBUMIN 3.9 01/25/2023   CALCIUM 9.0 01/25/2023   GFRAA 111 09/04/2020   QFTBGOLDPLUS Negative 10/13/2022    Speciality Comments: No specialty comments available.  Procedures:  No procedures performed Allergies: Penicillins   Assessment / Plan:     Visit Diagnoses: No diagnosis found.  Orders: No  orders of the defined types were placed in this encounter.  No orders of the defined types were placed in this encounter.   Face-to-face time spent with patient was *** minutes. Greater than 50% of time was spent in counseling and coordination of care.  Follow-Up Instructions: No follow-ups on file.   Ellen Henri, CMA  Note - This record has been created using Animal nutritionist.  Chart creation errors have been sought, but may not always  have been located. Such creation errors do not reflect on  the standard of medical care.

## 2023-06-03 ENCOUNTER — Ambulatory Visit: Payer: Medicare PPO | Admitting: Rheumatology

## 2023-06-03 DIAGNOSIS — M1990 Unspecified osteoarthritis, unspecified site: Secondary | ICD-10-CM

## 2023-06-03 DIAGNOSIS — Z8669 Personal history of other diseases of the nervous system and sense organs: Secondary | ICD-10-CM

## 2023-06-03 DIAGNOSIS — Z13820 Encounter for screening for osteoporosis: Secondary | ICD-10-CM

## 2023-06-03 DIAGNOSIS — H15102 Unspecified episcleritis, left eye: Secondary | ICD-10-CM

## 2023-06-03 DIAGNOSIS — D75839 Thrombocytosis, unspecified: Secondary | ICD-10-CM

## 2023-06-03 DIAGNOSIS — K7689 Other specified diseases of liver: Secondary | ICD-10-CM

## 2023-06-03 DIAGNOSIS — E041 Nontoxic single thyroid nodule: Secondary | ICD-10-CM

## 2023-06-03 DIAGNOSIS — Z8709 Personal history of other diseases of the respiratory system: Secondary | ICD-10-CM

## 2023-06-03 DIAGNOSIS — Z8719 Personal history of other diseases of the digestive system: Secondary | ICD-10-CM

## 2023-06-03 DIAGNOSIS — M170 Bilateral primary osteoarthritis of knee: Secondary | ICD-10-CM

## 2023-06-03 DIAGNOSIS — M461 Sacroiliitis, not elsewhere classified: Secondary | ICD-10-CM

## 2023-06-03 DIAGNOSIS — I10 Essential (primary) hypertension: Secondary | ICD-10-CM

## 2023-06-03 DIAGNOSIS — K50919 Crohn's disease, unspecified, with unspecified complications: Secondary | ICD-10-CM

## 2023-06-03 DIAGNOSIS — Z79899 Other long term (current) drug therapy: Secondary | ICD-10-CM

## 2023-06-03 DIAGNOSIS — R5383 Other fatigue: Secondary | ICD-10-CM

## 2023-06-03 DIAGNOSIS — M19041 Primary osteoarthritis, right hand: Secondary | ICD-10-CM

## 2023-06-04 NOTE — Progress Notes (Signed)
Office Visit Note  Patient: Tammy Carey             Date of Birth: 1955/08/05           MRN: [id number]             PCP: Abner Greenspan, MD Referring: Abner Greenspan, MD Visit Date: 06/16/2023 Occupation: @GUAROCC @  Subjective:  Medication management  History of Present Illness: Cindie Daigler is a 67 y.o. female with history of Crohn's disease, episcleritis and chronic inflammatory arthritis she states that has been off methotrexate since May 2024.  According the patient she discussed with Dr. Chales Abrahams and after spacing Humira every 4 weeks she discontinued Humira in July 2024.  She has had no flares of Crohn's disease or episcleritis.  She denies any joint pain or joint swelling.  She continues to have some diarrhea and constipation due to underlying IBS.  She denies any recurrence of sacroiliitis.    Activities of Daily Living:  Patient reports morning stiffness for 0 minutes.   Patient Denies nocturnal pain.  Difficulty dressing/grooming: Denies Difficulty climbing stairs: Denies Difficulty getting out of chair: Denies Difficulty using hands for taps, buttons, cutlery, and/or writing: Denies  Review of Systems  Constitutional:  Negative for fatigue.  HENT:  Negative for mouth sores and mouth dryness.   Eyes:  Negative for dryness.  Respiratory:  Negative for shortness of breath.   Cardiovascular:  Negative for chest pain and palpitations.  Gastrointestinal:  Negative for blood in stool, constipation and diarrhea.  Endocrine: Negative for increased urination.  Genitourinary:  Negative for involuntary urination.  Musculoskeletal:  Positive for joint pain and joint pain. Negative for gait problem, joint swelling, myalgias, muscle weakness, morning stiffness, muscle tenderness and myalgias.  Skin:  Negative for color change, rash, hair loss and sensitivity to sunlight.  Allergic/Immunologic: Negative for susceptible to infections.  Neurological:  Negative for dizziness and  headaches.  Hematological:  Negative for swollen glands.  Psychiatric/Behavioral:  Negative for depressed mood and sleep disturbance. The patient is not nervous/anxious.     PMFS History:  Patient Active Problem List   Diagnosis Date Noted   Bilateral sacroiliitis (HCC) 03/20/2021   Primary osteoarthritis of both hands 03/20/2021   Primary osteoarthritis of both knees 03/20/2021   Crohn's disease of both small and large intestine without complication (HCC) 12/03/2020   Nuclear sclerotic cataract of both eyes 12/03/2020   Hx of migraines 07/02/2020   History of asthma 07/02/2020   History of IBS 07/02/2020   History of diverticulitis 07/02/2020   Crohn's disease with complication (HCC) 07/02/2020   Essential hypertension 07/02/2020   Episcleritis of left eye 07/02/2020    Past Medical History:  Diagnosis Date   Anemia    Asthma    Crohn's disease (HCC)    Hx of migraine headaches    Hypertension    Pulsatile tinnitus     Family History  Problem Relation Age of Onset   Hypertension Mother    Hypercholesterolemia Mother    Asthma Father    COPD Father    Prostate cancer Father    Hypertension Brother    Hypercholesterolemia Brother    ADD / ADHD Son    Rheum arthritis Cousin    Rheum arthritis Cousin    Colon cancer Neg Hx    Past Surgical History:  Procedure Laterality Date   APPENDECTOMY     BREAST ENHANCEMENT SURGERY     Per patient - in the 80's, replaced around 2008  COLONOSCOPY  11/05/2008   Mild colonic diverticulosis. Otherwise normal colonoscopy to TI. Minimal activity by endoscopic criteria.    ESOPHAGOGASTRODUODENOSCOPY  11/01/2008   Normal EGD.    KNEE SURGERY Right 10/2021   miniscus repair   LAPAROTOMY     for endometriosis   Social History   Social History Narrative   Not on file   Immunization History  Administered Date(s) Administered   Moderna Sars-Covid-2 Vaccination 08/29/2020   PFIZER(Purple Top)SARS-COV-2 Vaccination 08/23/2019,  09/13/2019     Objective: Vital Signs: BP 113/64 (BP Location: Left Arm, Patient Position: Sitting, Cuff Size: Normal)   Pulse 84   Resp 14   Ht 5\' 6"  (1.676 m)   Wt 196 lb (88.9 kg)   BMI 31.64 kg/m    Physical Exam Vitals and nursing note reviewed.  Constitutional:      Appearance: She is well-developed.  HENT:     Head: Normocephalic and atraumatic.  Eyes:     Conjunctiva/sclera: Conjunctivae normal.  Cardiovascular:     Rate and Rhythm: Normal rate and regular rhythm.     Heart sounds: Normal heart sounds.  Pulmonary:     Effort: Pulmonary effort is normal.     Breath sounds: Normal breath sounds.  Abdominal:     General: Bowel sounds are normal.     Palpations: Abdomen is soft.  Musculoskeletal:     Cervical back: Normal range of motion.  Lymphadenopathy:     Cervical: No cervical adenopathy.  Skin:    General: Skin is warm and dry.     Capillary Refill: Capillary refill takes less than 2 seconds.  Neurological:     Mental Status: She is alert and oriented to person, place, and time.  Psychiatric:        Behavior: Behavior normal.      Musculoskeletal Exam: Cervical, thoracic and lumbar spine were in good range of motion.  She had no SI joint tenderness.  Shoulder joints, elbow joints, wrist joints, MCPs PIPs and DIPs were in good range of motion with no synovitis.  Hip joints, knee joints, ankles, MTPs and PIPs were in good range of motion with no synovitis.  There was no plantar fasciitis or Achilles tendinitis.  CDAI Exam: CDAI Score: -- Patient Global: --; Provider Global: -- Swollen: --; Tender: -- Joint Exam 06/16/2023   No joint exam has been documented for this visit   There is currently no information documented on the homunculus. Go to the Rheumatology activity and complete the homunculus joint exam.  Investigation: No additional findings.  Imaging: No results found.  Recent Labs: Lab Results  Component Value Date   WBC 7.2 01/25/2023    HGB 11.5 01/25/2023   PLT 445 01/25/2023   NA 138 01/25/2023   K 4.7 01/25/2023   CL 103 01/25/2023   CO2 27 01/25/2023   GLUCOSE 105 (H) 01/25/2023   BUN 13 01/25/2023   CREATININE 0.73 01/25/2023   BILITOT 0.2 01/25/2023   ALKPHOS 68 01/25/2023   AST 15 01/25/2023   ALT 11 01/25/2023   PROT 6.4 01/25/2023   ALBUMIN 3.9 01/25/2023   CALCIUM 9.0 01/25/2023   GFRAA 111 09/04/2020   QFTBGOLDPLUS Negative 10/13/2022    Speciality Comments: No specialty comments available.  Procedures:  No procedures performed Allergies: Penicillins   Assessment / Plan:     Visit Diagnoses: Crohn's disease with complication, unspecified gastrointestinal tract location [facility]) -patient has been on Lialda and Humira for Crohn's disease.  Patient states she discussed  with Dr. Chales Abrahams and discontinued Humira in July 2024.  Patient states she has not had a flare of Crohn's disease since then.  She has IBS and has diarrhea and constipation related to IBS.  High risk medication use -(Humira 40 mg subcu every 14 days-discontinue July 2024) (previously on methotrexate 0.2 mL subcu injections once weekly, folic acid 2 mg po qd).   Chronic inflammatory arthritis-patient denies any flares of arthritis.  No synovitis was noted on the examination.  Patient was advised to contact us if she develops a flare.  Episcleritis of left eye -patient denies any recent episodes.  She used to have episcleritis every 8 weeks since 2020.Last episode 01/2020.  She stays asymptomatic.  She is followed by Dr. Sherryll Burger.  Bilateral sacroiliitis (HCC)-she had no SI joint tenderness on the examination today.  Primary osteoarthritis of both hands-she is DIP and PIP thickening but denies any discomfort.  Primary osteoarthritis of both knees-she denies any discomfort in her knee joints.  Other medical problems are listed as follows:  Thrombocytosis  History of diverticulitis - Patient had 2 episodes of diverticulitis since March 2023.   She has been followed by Dr. Chales Abrahams.  Hepatic cyst  History of IBS  History of asthma  Thyroid nodule  Essential hypertension  Other fatigue  Hx of migraines  Osteoporosis screening - Left femoral neck T-score - 0.9 BMD 0.919 on February 17, 2022.  Orders: No orders of the defined types were placed in this encounter.  No orders of the defined types were placed in this encounter.    Follow-Up Instructions: Return in about 1 year (around 06/15/2024) for  arthritis, Crohn's.   Pollyann Savoy, MD  Note - This record has been created using Animal nutritionist.  Chart creation errors have been sought, but may not always  have been located. Such creation errors do not reflect on  the standard of medical care.

## 2023-06-16 ENCOUNTER — Ambulatory Visit: Payer: Medicare PPO | Attending: Rheumatology | Admitting: Rheumatology

## 2023-06-16 ENCOUNTER — Encounter: Payer: Self-pay | Admitting: Rheumatology

## 2023-06-16 VITALS — BP 113/64 | HR 84 | Resp 14 | Ht 66.0 in | Wt 196.0 lb

## 2023-06-16 DIAGNOSIS — Z8669 Personal history of other diseases of the nervous system and sense organs: Secondary | ICD-10-CM

## 2023-06-16 DIAGNOSIS — Z8719 Personal history of other diseases of the digestive system: Secondary | ICD-10-CM

## 2023-06-16 DIAGNOSIS — K7689 Other specified diseases of liver: Secondary | ICD-10-CM

## 2023-06-16 DIAGNOSIS — M461 Sacroiliitis, not elsewhere classified: Secondary | ICD-10-CM

## 2023-06-16 DIAGNOSIS — M19042 Primary osteoarthritis, left hand: Secondary | ICD-10-CM

## 2023-06-16 DIAGNOSIS — M1990 Unspecified osteoarthritis, unspecified site: Secondary | ICD-10-CM | POA: Diagnosis not present

## 2023-06-16 DIAGNOSIS — M170 Bilateral primary osteoarthritis of knee: Secondary | ICD-10-CM

## 2023-06-16 DIAGNOSIS — E041 Nontoxic single thyroid nodule: Secondary | ICD-10-CM

## 2023-06-16 DIAGNOSIS — Z13820 Encounter for screening for osteoporosis: Secondary | ICD-10-CM

## 2023-06-16 DIAGNOSIS — Z79899 Other long term (current) drug therapy: Secondary | ICD-10-CM

## 2023-06-16 DIAGNOSIS — R5383 Other fatigue: Secondary | ICD-10-CM

## 2023-06-16 DIAGNOSIS — H15102 Unspecified episcleritis, left eye: Secondary | ICD-10-CM

## 2023-06-16 DIAGNOSIS — I10 Essential (primary) hypertension: Secondary | ICD-10-CM

## 2023-06-16 DIAGNOSIS — M1380 Other specified arthritis, unspecified site: Secondary | ICD-10-CM

## 2023-06-16 DIAGNOSIS — D75839 Thrombocytosis, unspecified: Secondary | ICD-10-CM

## 2023-06-16 DIAGNOSIS — M19041 Primary osteoarthritis, right hand: Secondary | ICD-10-CM | POA: Diagnosis not present

## 2023-06-16 DIAGNOSIS — K50919 Crohn's disease, unspecified, with unspecified complications: Secondary | ICD-10-CM | POA: Diagnosis not present

## 2023-06-16 DIAGNOSIS — Z8709 Personal history of other diseases of the respiratory system: Secondary | ICD-10-CM

## 2023-06-17 DIAGNOSIS — Z713 Dietary counseling and surveillance: Secondary | ICD-10-CM | POA: Diagnosis not present

## 2023-06-17 DIAGNOSIS — Z6832 Body mass index (BMI) 32.0-32.9, adult: Secondary | ICD-10-CM | POA: Diagnosis not present

## 2023-06-17 DIAGNOSIS — E66811 Obesity, class 1: Secondary | ICD-10-CM | POA: Diagnosis not present

## 2023-06-19 ENCOUNTER — Other Ambulatory Visit: Payer: Self-pay | Admitting: Gastroenterology

## 2023-08-09 DIAGNOSIS — Z6831 Body mass index (BMI) 31.0-31.9, adult: Secondary | ICD-10-CM | POA: Diagnosis not present

## 2023-08-09 DIAGNOSIS — N760 Acute vaginitis: Secondary | ICD-10-CM | POA: Diagnosis not present

## 2023-08-09 DIAGNOSIS — Z23 Encounter for immunization: Secondary | ICD-10-CM | POA: Diagnosis not present

## 2023-08-09 DIAGNOSIS — B9689 Other specified bacterial agents as the cause of diseases classified elsewhere: Secondary | ICD-10-CM | POA: Diagnosis not present

## 2023-08-10 DIAGNOSIS — Z713 Dietary counseling and surveillance: Secondary | ICD-10-CM | POA: Diagnosis not present

## 2023-08-10 DIAGNOSIS — E66811 Obesity, class 1: Secondary | ICD-10-CM | POA: Diagnosis not present

## 2023-08-10 DIAGNOSIS — Z6831 Body mass index (BMI) 31.0-31.9, adult: Secondary | ICD-10-CM | POA: Diagnosis not present

## 2023-10-25 DIAGNOSIS — Z6831 Body mass index (BMI) 31.0-31.9, adult: Secondary | ICD-10-CM | POA: Diagnosis not present

## 2023-10-25 DIAGNOSIS — J069 Acute upper respiratory infection, unspecified: Secondary | ICD-10-CM | POA: Diagnosis not present

## 2023-10-25 DIAGNOSIS — Z20822 Contact with and (suspected) exposure to covid-19: Secondary | ICD-10-CM | POA: Diagnosis not present

## 2023-11-01 DIAGNOSIS — J22 Unspecified acute lower respiratory infection: Secondary | ICD-10-CM | POA: Diagnosis not present

## 2023-11-01 DIAGNOSIS — K5090 Crohn's disease, unspecified, without complications: Secondary | ICD-10-CM | POA: Diagnosis not present

## 2023-11-01 DIAGNOSIS — J45901 Unspecified asthma with (acute) exacerbation: Secondary | ICD-10-CM | POA: Diagnosis not present

## 2023-11-01 DIAGNOSIS — Z6830 Body mass index (BMI) 30.0-30.9, adult: Secondary | ICD-10-CM | POA: Diagnosis not present

## 2023-11-03 DIAGNOSIS — E66811 Obesity, class 1: Secondary | ICD-10-CM | POA: Diagnosis not present

## 2023-11-03 DIAGNOSIS — Z713 Dietary counseling and surveillance: Secondary | ICD-10-CM | POA: Diagnosis not present

## 2023-11-03 DIAGNOSIS — Z6830 Body mass index (BMI) 30.0-30.9, adult: Secondary | ICD-10-CM | POA: Diagnosis not present

## 2023-11-30 DIAGNOSIS — R6882 Decreased libido: Secondary | ICD-10-CM | POA: Diagnosis not present

## 2023-11-30 DIAGNOSIS — Z6831 Body mass index (BMI) 31.0-31.9, adult: Secondary | ICD-10-CM | POA: Diagnosis not present

## 2023-11-30 DIAGNOSIS — J0100 Acute maxillary sinusitis, unspecified: Secondary | ICD-10-CM | POA: Diagnosis not present

## 2023-11-30 DIAGNOSIS — R519 Headache, unspecified: Secondary | ICD-10-CM | POA: Diagnosis not present

## 2024-01-03 DIAGNOSIS — H5203 Hypermetropia, bilateral: Secondary | ICD-10-CM | POA: Diagnosis not present

## 2024-01-03 DIAGNOSIS — H524 Presbyopia: Secondary | ICD-10-CM | POA: Diagnosis not present

## 2024-01-03 DIAGNOSIS — H25813 Combined forms of age-related cataract, bilateral: Secondary | ICD-10-CM | POA: Diagnosis not present

## 2024-01-03 DIAGNOSIS — H52223 Regular astigmatism, bilateral: Secondary | ICD-10-CM | POA: Diagnosis not present

## 2024-04-04 DIAGNOSIS — Z6830 Body mass index (BMI) 30.0-30.9, adult: Secondary | ICD-10-CM | POA: Diagnosis not present

## 2024-04-04 DIAGNOSIS — J45901 Unspecified asthma with (acute) exacerbation: Secondary | ICD-10-CM | POA: Diagnosis not present

## 2024-05-29 DIAGNOSIS — Z1231 Encounter for screening mammogram for malignant neoplasm of breast: Secondary | ICD-10-CM | POA: Diagnosis not present

## 2024-06-12 DIAGNOSIS — N6001 Solitary cyst of right breast: Secondary | ICD-10-CM | POA: Diagnosis not present

## 2024-06-12 DIAGNOSIS — R928 Other abnormal and inconclusive findings on diagnostic imaging of breast: Secondary | ICD-10-CM | POA: Diagnosis not present

## 2024-06-15 ENCOUNTER — Ambulatory Visit: Payer: Medicare PPO | Admitting: Rheumatology

## 2024-06-19 DIAGNOSIS — R051 Acute cough: Secondary | ICD-10-CM | POA: Diagnosis not present

## 2024-06-19 DIAGNOSIS — J40 Bronchitis, not specified as acute or chronic: Secondary | ICD-10-CM | POA: Diagnosis not present

## 2024-06-22 ENCOUNTER — Other Ambulatory Visit: Payer: Self-pay | Admitting: Gastroenterology

## 2024-08-10 ENCOUNTER — Ambulatory Visit: Admitting: Rheumatology
